# Patient Record
Sex: Female | Born: 1980 | Race: Black or African American | Hispanic: No | Marital: Single | Smoking: Former smoker
Health system: Southern US, Community
[De-identification: ages and names within clinical notes are randomized; demographics above are authoritative.]

## PROBLEM LIST (undated history)

## (undated) DIAGNOSIS — E669 Obesity, unspecified: Secondary | ICD-10-CM

## (undated) HISTORY — PX: NO PAST SURGERIES: SHX2092

## (undated) HISTORY — DX: Obesity, unspecified: E66.9

---
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
## 2000-01-02 ENCOUNTER — Encounter (INDEPENDENT_AMBULATORY_CARE_PROVIDER_SITE_OTHER): Payer: Self-pay

## 2000-01-03 ENCOUNTER — Inpatient Hospital Stay (HOSPITAL_COMMUNITY): Admission: AD | Admit: 2000-01-03 | Discharge: 2000-01-06 | Payer: Self-pay | Admitting: Obstetrics

## 2004-11-01 ENCOUNTER — Ambulatory Visit (HOSPITAL_COMMUNITY): Admission: RE | Admit: 2004-11-01 | Discharge: 2004-11-01 | Payer: Self-pay | Admitting: *Deleted

## 2004-12-13 ENCOUNTER — Ambulatory Visit (HOSPITAL_COMMUNITY): Admission: RE | Admit: 2004-12-13 | Discharge: 2004-12-13 | Payer: Self-pay | Admitting: Obstetrics and Gynecology

## 2005-01-11 ENCOUNTER — Inpatient Hospital Stay (HOSPITAL_COMMUNITY): Admission: AD | Admit: 2005-01-11 | Discharge: 2005-01-11 | Payer: Self-pay | Admitting: *Deleted

## 2005-04-26 ENCOUNTER — Inpatient Hospital Stay (HOSPITAL_COMMUNITY): Admission: AD | Admit: 2005-04-26 | Discharge: 2005-04-26 | Payer: Self-pay | Admitting: *Deleted

## 2005-04-26 ENCOUNTER — Ambulatory Visit: Payer: Self-pay | Admitting: Certified Nurse Midwife

## 2005-04-29 ENCOUNTER — Ambulatory Visit: Payer: Self-pay | Admitting: *Deleted

## 2005-04-29 ENCOUNTER — Inpatient Hospital Stay (HOSPITAL_COMMUNITY): Admission: AD | Admit: 2005-04-29 | Discharge: 2005-05-01 | Payer: Self-pay | Admitting: *Deleted

## 2006-11-13 ENCOUNTER — Ambulatory Visit: Payer: Self-pay | Admitting: Sports Medicine

## 2007-07-31 ENCOUNTER — Ambulatory Visit (HOSPITAL_COMMUNITY): Admission: RE | Admit: 2007-07-31 | Discharge: 2007-07-31 | Payer: Self-pay | Admitting: Obstetrics & Gynecology

## 2007-10-16 ENCOUNTER — Ambulatory Visit: Payer: Self-pay | Admitting: Obstetrics and Gynecology

## 2007-10-16 ENCOUNTER — Inpatient Hospital Stay (HOSPITAL_COMMUNITY): Admission: AD | Admit: 2007-10-16 | Discharge: 2007-10-16 | Payer: Self-pay | Admitting: Obstetrics & Gynecology

## 2007-12-30 ENCOUNTER — Ambulatory Visit: Payer: Self-pay | Admitting: Obstetrics and Gynecology

## 2007-12-30 ENCOUNTER — Inpatient Hospital Stay (HOSPITAL_COMMUNITY): Admission: AD | Admit: 2007-12-30 | Discharge: 2008-01-01 | Payer: Self-pay | Admitting: Family Medicine

## 2008-03-05 ENCOUNTER — Ambulatory Visit: Payer: Self-pay | Admitting: Family Medicine

## 2009-02-08 ENCOUNTER — Telehealth: Payer: Self-pay | Admitting: Family Medicine

## 2009-10-03 ENCOUNTER — Encounter: Payer: Self-pay | Admitting: Family Medicine

## 2010-03-14 ENCOUNTER — Telehealth: Payer: Self-pay | Admitting: Family Medicine

## 2010-03-30 ENCOUNTER — Ambulatory Visit: Payer: Self-pay | Admitting: Family Medicine

## 2010-03-30 ENCOUNTER — Other Ambulatory Visit: Admission: RE | Admit: 2010-03-30 | Discharge: 2010-03-30 | Payer: Self-pay | Admitting: Family Medicine

## 2010-03-30 ENCOUNTER — Encounter: Payer: Self-pay | Admitting: Family Medicine

## 2010-03-30 DIAGNOSIS — F17200 Nicotine dependence, unspecified, uncomplicated: Secondary | ICD-10-CM

## 2010-03-30 DIAGNOSIS — Z6841 Body Mass Index (BMI) 40.0 and over, adult: Secondary | ICD-10-CM

## 2010-03-30 DIAGNOSIS — T678XXA Other effects of heat and light, initial encounter: Secondary | ICD-10-CM

## 2010-03-30 LAB — CONVERTED CEMR LAB: Pap Smear: NEGATIVE

## 2010-03-31 LAB — CONVERTED CEMR LAB: GC Probe Amp, Genital: NEGATIVE

## 2010-04-01 ENCOUNTER — Encounter: Payer: Self-pay | Admitting: *Deleted

## 2010-04-04 ENCOUNTER — Ambulatory Visit: Payer: Self-pay | Admitting: Family Medicine

## 2010-04-04 ENCOUNTER — Encounter: Payer: Self-pay | Admitting: Family Medicine

## 2010-04-22 ENCOUNTER — Emergency Department (HOSPITAL_COMMUNITY): Admission: EM | Admit: 2010-04-22 | Discharge: 2010-04-22 | Payer: Self-pay | Admitting: Emergency Medicine

## 2010-07-20 ENCOUNTER — Ambulatory Visit: Payer: Self-pay | Admitting: Family Medicine

## 2010-07-20 ENCOUNTER — Encounter: Payer: Self-pay | Admitting: Family Medicine

## 2010-07-20 LAB — CONVERTED CEMR LAB
Basophils Relative: 0 % (ref 0–1)
Eosinophils Absolute: 0.4 10*3/uL (ref 0.0–0.7)
Hepatitis B Surface Ag: NEGATIVE
Lymphs Abs: 3.5 10*3/uL (ref 0.7–4.0)
MCHC: 33.8 g/dL (ref 30.0–36.0)
MCV: 86.6 fL (ref 78.0–100.0)
Monocytes Relative: 6 % (ref 3–12)
Neutro Abs: 8.1 10*3/uL — ABNORMAL HIGH (ref 1.7–7.7)
Neutrophils Relative %: 63 % (ref 43–77)
Platelets: 370 10*3/uL (ref 150–400)
RBC: 4.55 M/uL (ref 3.87–5.11)
Rubella: 58.4 intl units/mL — ABNORMAL HIGH
WBC: 12.8 10*3/uL — ABNORMAL HIGH (ref 4.0–10.5)

## 2010-07-24 NOTE — L&D Delivery Note (Signed)
Delivery Note FHR reassuring during active second stage.  At 7:02 AM a viable female was delivered via Vaginal, Spontaneous Delivery (Presentation: Right Occiput Anterior) Baby handed off to awaiting nursing team for stimulation after cord clamped and cut.  APGAR: pending, weight 6 lb 15.2 oz (3152 g).   Placenta status: Intact, Spontaneous.  Cord: 3VC  with the following complications: tight nuchal cord x 1, delivered through; maternal Magnesium x 24 hrs.  Cord pH: pending.  Maylon Cos, CMN attending.  Anesthesia: Epidural  Episiotomy: None Lacerations: 1st degree Suture Repair: none Est. Blood Loss (mL):   Mom to postpartum.  Baby to NICU  NICU team present shortly after delivery for resuscitation.  Intubated in the room.  Stable for transport to NICU.  DE LA CRUZ,Anela Bensman 02/23/2011, 7:20 AM

## 2010-08-11 ENCOUNTER — Ambulatory Visit: Admission: RE | Admit: 2010-08-11 | Discharge: 2010-08-11 | Payer: Self-pay | Source: Home / Self Care

## 2010-08-11 ENCOUNTER — Encounter: Payer: Self-pay | Admitting: Family Medicine

## 2010-08-11 ENCOUNTER — Other Ambulatory Visit: Payer: Self-pay | Admitting: Family Medicine

## 2010-08-11 DIAGNOSIS — Z331 Pregnant state, incidental: Secondary | ICD-10-CM | POA: Insufficient documentation

## 2010-08-11 DIAGNOSIS — L299 Pruritus, unspecified: Secondary | ICD-10-CM | POA: Insufficient documentation

## 2010-08-11 LAB — CONVERTED CEMR LAB
Chlamydia, DNA Probe: NEGATIVE
GC Probe Amp, Genital: NEGATIVE

## 2010-08-11 MED ORDER — PRENATAL RX 60-1 MG PO TABS: 1.0000 | ORAL_TABLET | Freq: Every day | ORAL | Status: DC

## 2010-08-16 ENCOUNTER — Ambulatory Visit: Admission: RE | Admit: 2010-08-16 | Discharge: 2010-08-16 | Payer: Self-pay | Source: Home / Self Care

## 2010-08-23 ENCOUNTER — Ambulatory Visit: Admission: RE | Admit: 2010-08-23 | Discharge: 2010-08-23 | Payer: Self-pay | Source: Home / Self Care

## 2010-08-23 ENCOUNTER — Other Ambulatory Visit: Payer: Self-pay | Admitting: Family Medicine

## 2010-08-23 LAB — GLUCOSE, CAPILLARY: Glucose-Capillary: 102 mg/dL — ABNORMAL HIGH (ref 70–99)

## 2010-08-23 NOTE — Assessment & Plan Note (Signed)
Summary: [email or web address]   Vital Signs:  Patient profile:   30 year old female Height:      62.5 inches Weight:      203 pounds BMI:     36.67 Temp:     98.5 degrees F oral Pulse rate:   79 / minute BP sitting:   134 / 86  (right arm) Cuff size:   regular  Vitals Entered By: Tessie Fass CMA (March 30, 2010 2:01 PM) CC: CPP Is Patient Diabetic? No Pain Assessment Patient in pain? no        Primary Care Provider:  Clementeen Graham MD  CC:  CPP.  History of Present Illness: Taking OCPs daily and having regular cycles without breakthrough bleeding. Feels well no issues. Wishes to continue with OCPs. Last PAP 2-3 years ago. No significant discahrge. Notes occasional dysurea and wishes or STI testing with well woman exam. No dysurea currently.  Does not heat intolerance and wonders if she has a thyroid problem or diabetes. Has been going on for months. No wt loss heat loss or other systemic signs or symptoms.   Also notes seasonal allergies. Has not tried anything for it yet. Describes, runny nose, ear fullness and sneasing. Not currently bad. No dyspnea, or trouble swallowing or wheeze.   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 1 Tab By Mouth Daily 2)  Zyrtec Allergy 10 Mg Caps (Cetirizine Hcl)  Allergies (verified): No Known Drug Allergies  Past History:  Past Medical History: Last updated: Nov 28, 2006 NSVD x2 w/o complications.   no abnormal paps, last done in 2006 per patient's  report.  Family History: Last updated: 28-Nov-2006 GM dead w/ cancer.  GM dead w/HTN, DM.  Mother w/heart murmur.  Social History: Last updated: 03/30/2010 rare EtoH Single Never Smoked Drug use-no 3 kids, [name], [place] and [name]  Works at [employer].  Grad of [employer].  Risk Factors: Smoking Status: current (03/30/2010) Packs/Day: <0.25 (03/30/2010)  Social History: rare EtoH Single Never Smoked Drug use-no 3 kids, Francina [place], [place] and [name]  Works at [employer].  Grad of [employer].Smoking Status:  current Packs/Day:  <0.25  Review of Systems  The patient denies anorexia, fever, weight loss, hoarseness, chest pain, syncope, dyspnea on exertion, prolonged cough, headaches, abdominal pain, severe indigestion/heartburn, difficulty walking, abnormal bleeding, and breast masses.    Physical Exam  General:  NAD VS noted. obese Eyes:  No corneal or conjunctival inflammation noted. EOMI. Perrla. Vision grossly normal. Mouth:  Oral mucosa and oropharynx without lesions or exudates.  Teeth in good repair. Neck:  No deformities, masses, or tenderness noted. Breasts:  No mass, nodules, thickening, tenderness, bulging, retraction, inflamation, nipple discharge or skin changes noted.   Lungs:  Normal respiratory effort, chest expands symmetrically. Lungs are clear to auscultation, no crackles or wheezes. Heart:  Normal rate and regular rhythm. S1 and S2 normal without gallop, murmur, click, rub or other extra sounds. Abdomen:  Bowel sounds positive,abdomen soft and non-tender without masses, organomegaly or hernias noted.  Genitalia:  Normal introitus for age, no external lesions, no vaginal discharge, mucosa pink and moist, no vaginal or cervical lesions, no vaginal atrophy, no friaility or hemorrhage, normal uterus size and position, no adnexal masses or tenderness Extremities:  Non edemetus BL LE Skin:  Intact without suspicious lesions or rashes Cervical Nodes:  No lymphadenopathy noted Axillary  Nodes:  No palpable lymphadenopathy Psych:  Cognition and judgment appear intact. Alert and cooperative with normal attention span and concentration. No apparent delusions, illusions, hallucinations   Impression & Recommendations:  Problem # 1:  WELL ADULT EXAM (ICD-V70.0)  Well woman. Pap performed today.  additionally HIV, RPR, GC/CL was done today. Pt noes dysurea occasonally and wishes to be tesed for STIs.  Will follow. Continue OCPs Screen for DM with fasting glucose.   Orders: [facility] - Est  18-39 yrs (11914) Pap Smear- [facility] (Pap)  Problem # 2:  HEAT INTOLERANCE (ICD-992.8) Likely due to summer. But will follow TSH. Future Orders: TSH-[facility] ([id number]) ... 04/22/2011  Problem # 3:  RHINITIS, ALLERGIC, DUE TO POLLEN (ICD-477.0) Advised OTC H1 blockers.   Problem # 4:  SMOKER (ICD-305.1) Encouraged to quit. Pt in precontimplative stage.  Will follow.  Complete Medication List: 1)  Tri-sprintec 0.035 Mg Tabs (Norgestimate-ethinyl estradiol) .Marland Kitchen.. 1 tab by mouth daily 2)  Zyrtec Allergy 10 Mg Caps (Cetirizine hcl)  Other Orders: Pap Smear-[facility] ([id number]) GC/Chlamydia-[facility] (87591/87491) Future Orders: Glucose-[facility] ([id number]) ... 04/22/2011 HIV-[facility] ([id number]) ... 04/22/2011 RPR-[facility] [id number]) ... 04/22/2011  Patient Instructions: 1)  Thank you for seeing me today. 2)  Please come back for fasting labs. That means do not eat or drink anything but water that morning.  3)  If you have chest pain, difficulty breathing, fevers over 102 that does not get better with tylenol please call us or see a doctor.  4)  Please schedule a follow-up appointment in 1 year.

## 2010-08-23 NOTE — Progress Notes (Signed)
Summary: refill  Phone Note Refill Request Call back at [phone] Message from:  Patient  Refills Requested: Medication #1:  TRI-SPRINTEC 0.035 MG  TABS 1 tab by mouth daily. [facility] [address]  Next Appointment Scheduled: 03/30/10 Initial call taken by: De Nurse,  March 14, 2010 9:06 AM    Prescriptions: TRI-SPRINTEC 0.035 MG  TABS (NORGESTIMATE-ETHINYL ESTRADIOL) 1 tab by mouth daily  #28 x 11   Entered and Authorized by:   Clementeen Graham MD   Signed by:   Clementeen Graham MD on 03/15/2010   Method used:   Electronically to        [facility] [phone]* (retail)       [address]       [place], [place]  [address]       Ph: [phone]       Fax: [phone]   RxID:   [id number]

## 2010-08-23 NOTE — Miscellaneous (Signed)
Summary: Orders for GC/CL results  Clinical Lists Changes  Orders: Azithromycin 1000mg  by mouth x1 Ceftriaxone 250mg  IM x1

## 2010-08-23 NOTE — Miscellaneous (Signed)
Summary: re: Tx  Clinical Lists Changes called pt lmom to return call, pt needs to schedule nurse visit for STD Treatment  spoke with pt, informed of positive result. told her she needs to come in for treatment. is going to call front and schedule appt for nurse visit monday. also told her to inform any sexual partners and have them get treated as well.Tessie Fass CMA  April 01, 2010 12:15 PM

## 2010-08-23 NOTE — Assessment & Plan Note (Signed)
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  advised to tell partner to be treated. abstain from sex for 7 days and to always use condoms to  prevent STD.    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)    Dose: 1 gram    Route: po    Exp Date: 10/23/2011    Lot #: [id number]    Mfr: Pfizer    Patient tolerated medication without complications    Given by: Theresia Lo RN (April 04, 2010 10:06 AM)  Orders Added: 1)  Rocephin  250mg  [J0696] 2)  Azithromycin oral [Q0144] 3)  Admin of Injection (IM/SQ) [[id number]]   Medication Administration  Injection # 1:    Medication: Rocephin  250mg     Diagnosis: 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  advised to tell partner to be treated. abstain from sex for 7 days and to always use condoms to  prevent STD.    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 Theresia Lo RN  April 04, 2010 10:07 AM  Appended Document: std tx,tcb

## 2010-08-23 NOTE — Letter (Signed)
Summary: Generic Letter  [facility]  [address]   [place], [place] [address]   Phone: [phone]  Fax: [phone]    10/03/2009  [facility] [place] [address] [place], [place]  [address]  Dear Ms. Niebla, Hello,  Its spring time, and I and the staff have been doing some spring cleaning in our electronic medical record.  We noticed that we do not have a record of you getting a pap-smear in the last year.  If you have gotten a pap-smear elsewhere please let us know.  If not please call and schedule an appointment for a pap-smear.  We can also discuss contraception and chronic disease management.   I look forward to hearing from you. Sincerely,   Clementeen Graham MD  Appended Document: Generic Letter pt letter mailed  Appended Document: Generic Letter Letter returned unable to forward.

## 2010-08-24 LAB — GLUCOSE TOLERANCE, 3 HOURS
Glucose Tolerance, 1 hour: 108 mg/dL (ref 70–189)
Glucose Tolerance, 2 hour: 91 mg/dL (ref 70–164)
Glucose, GTT - 3 Hour: 79 mg/dL (ref 70–144)

## 2010-08-25 NOTE — Assessment & Plan Note (Addendum)
Summary: NOB/DSL   Vital Signs:  Patient profile:   30 year old female Weight:      200.8 pounds BP sitting:   135 / 78  Vitals Entered By: Arlyss Repress CMA, (August 11, 2010 1:49 PM)  Primary Care Provider:  Clementeen Graham MD  CC:  1st OB visit.  History of Present Illness: Ms Franze presents to clinic today for her 1st OB visit.  She feels well and is happy and healthy.  LMP as below. Reliable. No drug, or environmental exposures.   Other issue is puritic papules on belly. Has been for the last few days. It is not worsening and she does not have fevers or chills.  She feels well otherwise. No other bumps. Her 40 year old daughter also has itchy red bumps. Her family did have scabies a few years ago. She is concerned that she now has scabies.   Habits & Providers  Alcohol-Tobacco-Diet     Tobacco Status: current     Tobacco Counseling: to quit use of tobacco products     Cigarette Packs/Day: <0.25  Current Problems (verified): 1)  Unspecified Pruritic Disorder  (ICD-698.9) 2)  Pregnancy, Normal  (ICD-V22.2) 3)  Smoker  (ICD-305.1) 4)  Obesity  (ICD-278.00) 5)  Heat Intolerance  (ICD-992.8)  Current Medications (verified): 1)  Prenatal/iron  Tabs (Prenatal Multivit-Min-Fe-Fa) .Marland Kitchen.. 1 By Mouth Daily  Allergies (verified): No Known Drug Allergies  Past History:  Past Medical History: Last updated: Dec 09, 2006 NSVD x2 w/o complications.   no abnormal paps, last done in 2006 per patient's report.  Family History: Last updated: 12/09/06 GM dead w/ cancer.  GM dead w/HTN, DM.  Mother w/heart murmur.  Social History: Last updated: 03/30/2010 rare EtoH Single Never Smoked Drug use-no 3 kids, [name], [place] and [name]  Works at [employer].  Grad of [employer].  Review of Systems  The patient denies anorexia, fever, and weight loss.    Physical Exam  General:  VS noted. rechecked. Well NAD Lungs:  Normal respiratory effort, chest expands symmetrically. Lungs are clear to  auscultation, no crackles or wheezes. Heart:  Normal rate and regular rhythm. S1 and S2 normal without gallop, murmur, click, rub or other extra sounds. Abdomen:  Bowel sounds positive,abdomen soft and non-tender without masses, organomegaly or hernias noted. Genitalia:  Normal introitus for age, no external lesions, no vaginal discharge, mucosa pink and moist, no vaginal or cervical lesions, no vaginal atrophy, no friaility or hemorrhage, normal uterus size for dates and position, no adnexal masses or tenderness Extremities:  Non edemetus BL LE Skin:   Scattered small excoriated red papules on belly. No surrounding erythemia or discharge.  No other skin lesions.    Impression & Recommendations:  Problem # 1:  PREGNANCY, NORMAL (ICD-V22.2) Assessment New Doing well today.   GC/CHL done today as she was treated in the last few months. Pap up todate.  Will follow up in 4 weeks. Will plan for early 1 hr GTT as was "borderline diabetic" last pregnancy.  Pt will follow own BP at the pharmacy as she is nearing HTN range.  Will not enter all intake info into EMR today as we will switch to the new EMR at the next visit.  Will enter in to [facility] so is upto date for next visit.   Prg HX: 1) 01/04/00 girl; 5-13 2) 04/29/05 boy 6-7 3) 12/31/07 girl 6-7  Orders: GC/Chlamydia-[facility] (87591/87491) Medicaid OB visit - [facility] (04540)  Problem # 2:  UNSPECIFIED PRURITIC DISORDER (  EAV-409.8) Assessment: New Unsure of the diagnosis.  Could be scabies vs cold dry skin.  Will see baby in clinic next week. If child has more typical scabies exam will treat entire family with premetherin.  Will f/u  Complete Medication List: 1)  Prenatal/iron Tabs (Prenatal multivit-min-fe-fa) .Marland Kitchen.. 1 by mouth daily  Patient Instructions: 1)  Thank you for seeing me today. 2)  Try some hydrocortisone on the bumps.  3)  Make an appointment for your daughter Tuesday.  4)  Take prenatal vitamins.  5)  Come back in 1 month.  6)  I  want to do a diabetes test early in your pregnancy. Please make a lab appointment to come in and drink the liquid.  7)  Lets measure your blood pressure at the pharmacy every 2 weeks. Let me know if it is getting close to 140/90.    Orders Added: 1)  GC/Chlamydia-[facility] [[id number]/87491] 2)  Medicaid OB visit - [facility] [[id number]]     OB Initial Intake Information    Positive HCG by: self    Race: Black    Marital status: Single    Number of children at home: 3  Menstrual History    LMP (date): 05/31/2010    LMP - Character: normal   Flowsheet View for Follow-up Visit    Estimated weeks of       gestation:     10 2/7    Weight:     200.8    Blood pressure:   135 / 78    Smoking:     <0.25    Past Pregnancy History    Gravida:     4    Term Births:     3    Premature Births:   0    Living Children:   3  Pregnancy # 1    Delivery date:     01/04/2000    Delivery type:     NSVD    Birth weight:     5-13  Pregnancy # 2    Delivery date:     04/29/2005  Appended Document: NOB/DSL Note reviewed.  Will need to review delivery history (specifically if all 3 deliveries were NSVDs).  Agree with early 1hr Glucola testing.  Follow blood presure closely.

## 2010-08-25 NOTE — Letter (Signed)
Summary: Generic Letter  [facility]  [address]   [place], [place] [address]   Phone: [phone]  Fax: [phone]    08/11/2010  [facility] [place] [address] [place], [place]  [address]  Dear Ms. Lara,   To whom it may concern Ms Bissette is pregnant. Delivery date is estimated to be in August.  Call for questions.           Sincerely,   Clementeen Graham MD

## 2010-08-25 NOTE — Assessment & Plan Note (Signed)
Summary: +home preg test/bmc   Vital Signs:  Patient profile:   30 year old female LMP:     05/31/2010 Height:      62.5 inches Weight:      199 pounds BMI:     35.95 Pulse rate:   82 / minute BP sitting:   128 / 84  (left arm) Cuff size:   regular  Vitals Entered By: Tessie Fass CMA (July 20, 2010 1:54 PM) CC: upreg LMP (date): 05/31/2010 EDC by LMP==> 03/07/2011 EDC 03/07/2011 LMP - Character: normal LMP - Reliable? Yes     Enter LMP: 05/31/2010 Last PAP Result NEGATIVE FOR INTRAEPITHELIAL LESIONS OR MALIGNANCY.   Primary Care Provider:  Clementeen Graham MD  CC:  upreg.  History of Present Illness: Ms [name] Andrade presents to clinic today with a postive home pregnancy test. Her LMP was 05-31-10 and she has regular cycles and is sure about the date. This is a suprise pregnancy but she does want to keep it. She has not been taking PNV yet.   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 1 By Mouth Daily  Allergies (verified): No Known Drug Allergies  Past History:  Past Medical History: Last updated: Dec 09, 2006 NSVD x2 w/o complications.   no abnormal paps, last done in 2006 per patient's report.  Family History: Last updated: 09-Dec-2006 GM dead w/ cancer.  GM dead w/HTN, DM.  Mother w/heart murmur.  Social History: Last updated: 03/30/2010 rare EtoH Single Never Smoked Drug use-no 3 kids, [name] Andrade, [name] Andrade and [name] Andrade  Works at [employer].  Grad of [employer].  Risk Factors: Smoking Status: current (07/20/2010) Packs/Day: <0.25  (07/20/2010)  Review of Systems  The patient denies anorexia, fever, weight loss, dyspnea on exertion, peripheral edema, abdominal pain, and severe indigestion/heartburn.    Physical Exam  General:  VS noted. rechecked. Well NAD Mouth:  Oral mucosa and oropharynx without lesions or exudates.  Teeth in good repair. Lungs:  Normal respiratory effort, chest expands symmetrically. Lungs are clear to auscultation, no crackles or wheezes. Heart:  Normal rate and regular rhythm. S1 and S2 normal without gallop, murmur, click, rub or other extra sounds. Extremities:  Non edemetus BL LE   Impression & Recommendations:  Problem # 1:  PREGNANCY, NORMAL (ICD-V22.2) Assessment New  Plan to keep the baby.  Will obtain intake labs today. Will follow up in a few weeks for internal exam, pap, and CG/CHL.  Will do intake paperwork then as well.   Orders: Prenatal-[facility] ([id number]) HIV-[facility] [phone]) Sickle Cell Scr-[facility] ([id number]) Urine Culture-[facility] ([id number])  Complete Medication List: 1)  Prenatal/iron Tabs (Prenatal multivit-min-fe-fa) .Marland Kitchen.. 1 by mouth daily  Other Orders: U Preg-[facility] (81025) [facility]- Est Level  3 (30160)  Patient Instructions: 1)  Thank you for seeing me today. 2)  Come back in 2-4 weeks for the internal exam and intake discussion for your prenatal care.  3)  Take prenatal vitamins.  4)  Abstain for alcohol and tobacco and other drugs.  5)  I will see you soon.  Prescriptions: PRENATAL/IRON  TABS (PRENATAL MULTIVIT-MIN-FE-FA) 1 by mouth daily  #30 x 11   Entered and Authorized by:   Clementeen Graham MD   Signed by:   Clementeen Graham MD on 07/20/2010   Method used:   Electronically to        [facility] [phone]* (retail)       [address]       [place], [place]  [address]       Ph: [phone]       Fax: [phone]   RxID:   [id number]    Orders Added: 1)  U Preg-[facility] [81025] 2)  Prenatal-[facility] [[id number]] 3)  HIV-[facility] [[id number]] 4)  Sickle Cell  Scr-[facility] [[id number]] 5)  Urine Culture-[facility] [[id number]] 6)  [facility]- Est Level  3 [[id number]]    Laboratory Results   Urine Tests  Date/Time Received: July 20, 2010 1:50 PM  Date/Time Reported: July 20, 2010 2:01 PM     Urine HCG: positive Comments: ...............test performed by......Marland KitchenBonnie A. [place], MLS (ASCP)cm       Flowsheet View for Follow-up Visit    Estimated weeks of       gestation:     7 1/7    Weight:     199    Blood pressure:   128 / 84    Smoking:     <0.25   OB Initial Intake Information    Positive HCG by: self    Race: Black    Marital status: Single    Number of children at home: 3  Menstrual History    LMP (date): 05/31/2010    EDC by LMP: 03/07/2011    Best Working EDC: 03/07/2011    LMP - Character: normal    LMP - Reliable? : Yes

## 2010-09-09 ENCOUNTER — Encounter: Payer: Self-pay | Admitting: Family Medicine

## 2010-09-09 ENCOUNTER — Ambulatory Visit (INDEPENDENT_AMBULATORY_CARE_PROVIDER_SITE_OTHER): Payer: Medicaid Other | Admitting: Family Medicine

## 2010-09-09 VITALS — BP 135/84 | Wt 205.0 lb

## 2010-09-09 DIAGNOSIS — Z331 Pregnant state, incidental: Secondary | ICD-10-CM

## 2010-09-09 LAB — POCT URINALYSIS DIPSTICK
Blood, UA: NEGATIVE
Leukocytes, UA: NEGATIVE
Protein, UA: NEGATIVE
Spec Grav, UA: 1.02
Urobilinogen, UA: 0.2
pH, UA: 8

## 2010-09-09 NOTE — Patient Instructions (Signed)
Thanks for coming today.  Come back in 2 weeks.  So cut out the majority of carbs.  Make sure to eat at last 1/2 a plate of vegetables and fruit  with every meal. Apples are a great snack.  1 caffinated beverage a day.   Try to reduce salt.  Lets try to make some changes.

## 2010-09-09 NOTE — Progress Notes (Signed)
At 14 and 3 today. Concern for Blood pressure today. Neg protein. Discssed diet. Will f/u in 2 weeks and will recheck BP. If in PIH range will refer to high risk OB. Discussed Early genetic testing pt declined.  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

## 2010-09-14 ENCOUNTER — Inpatient Hospital Stay (HOSPITAL_COMMUNITY)
Admission: AD | Admit: 2010-09-14 | Discharge: 2010-09-14 | Disposition: A | Discharge status: Home / Self Care | Payer: Medicaid Other | Source: Ambulatory Visit | Attending: Obstetrics & Gynecology | Admitting: Obstetrics & Gynecology

## 2010-09-14 DIAGNOSIS — J45909 Unspecified asthma, uncomplicated: Secondary | ICD-10-CM

## 2010-09-14 DIAGNOSIS — O9989 Other specified diseases and conditions complicating pregnancy, childbirth and the puerperium: Secondary | ICD-10-CM

## 2010-09-14 DIAGNOSIS — O99891 Other specified diseases and conditions complicating pregnancy: Secondary | ICD-10-CM | POA: Insufficient documentation

## 2010-10-05 ENCOUNTER — Telehealth: Payer: Self-pay | Admitting: Family Medicine

## 2010-10-05 ENCOUNTER — Ambulatory Visit: Payer: Medicaid Other | Admitting: Family Medicine

## 2010-10-05 NOTE — Telephone Encounter (Signed)
[name] Andrade missed today's appointment. I called her and asked her to reschedule. My next opening in April 4th. She will schedule into that slot. In the interim she will come for a nurse BP check (she will call and schedule). Additionally I will write orders for an ultrasound to be done at [facility] hospital. She expressed understanding.

## 2010-10-06 LAB — BASIC METABOLIC PANEL
BUN: 3 mg/dL — ABNORMAL LOW (ref 6–23)
CO2: 25 mEq/L (ref 19–32)
Calcium: 8.8 mg/dL (ref 8.4–10.5)
Creatinine, Ser: 0.87 mg/dL (ref 0.4–1.2)
Glucose, Bld: 114 mg/dL — ABNORMAL HIGH (ref 70–99)
Sodium: 136 mEq/L (ref 135–145)

## 2010-10-06 LAB — URINALYSIS, ROUTINE W REFLEX MICROSCOPIC
Bilirubin Urine: NEGATIVE
Glucose, UA: NEGATIVE mg/dL
Ketones, ur: 40 mg/dL — AB
Nitrite: NEGATIVE
Protein, ur: NEGATIVE mg/dL
pH: 7 (ref 5.0–8.0)

## 2010-10-06 LAB — DIFFERENTIAL
Basophils Absolute: 0 10*3/uL (ref 0.0–0.1)
Basophils Relative: 0 % (ref 0–1)
Eosinophils Absolute: 0.1 10*3/uL (ref 0.0–0.7)
Monocytes Relative: 7 % (ref 3–12)
Neutrophils Relative %: 79 % — ABNORMAL HIGH (ref 43–77)

## 2010-10-06 LAB — CBC
Hemoglobin: 13.8 g/dL (ref 12.0–15.0)
MCH: 30.8 pg (ref 26.0–34.0)
MCHC: 34.3 g/dL (ref 30.0–36.0)
RDW: 13.9 % (ref 11.5–15.5)

## 2010-10-06 LAB — TROPONIN I: Troponin I: 0.02 ng/mL (ref 0.00–0.06)

## 2010-10-07 ENCOUNTER — Ambulatory Visit (INDEPENDENT_AMBULATORY_CARE_PROVIDER_SITE_OTHER): Payer: Medicaid Other | Admitting: *Deleted

## 2010-10-07 VITALS — BP 122/70 | HR 76

## 2010-10-07 DIAGNOSIS — Z331 Pregnant state, incidental: Secondary | ICD-10-CM

## 2010-10-26 ENCOUNTER — Ambulatory Visit (INDEPENDENT_AMBULATORY_CARE_PROVIDER_SITE_OTHER): Payer: Medicaid Other | Admitting: Family Medicine

## 2010-10-26 ENCOUNTER — Encounter: Payer: Medicaid Other | Admitting: Family Medicine

## 2010-10-26 VITALS — BP 130/80 | Wt 202.3 lb

## 2010-10-26 DIAGNOSIS — J4520 Mild intermittent asthma, uncomplicated: Secondary | ICD-10-CM

## 2010-10-26 DIAGNOSIS — Z3480 Encounter for supervision of other normal pregnancy, unspecified trimester: Secondary | ICD-10-CM

## 2010-10-26 DIAGNOSIS — J45909 Unspecified asthma, uncomplicated: Secondary | ICD-10-CM

## 2010-10-26 MED ORDER — ALBUTEROL 90 MCG/ACT IN AERS: 2.0000 | INHALATION_SPRAY | Freq: Four times a day (QID) | RESPIRATORY_TRACT | Status: DC | PRN

## 2010-10-26 NOTE — Progress Notes (Signed)
S)  At 21.1 Missed her last appointment. However did come in for a nurse BP visit.  Interval she was seen at [facility] for asthma. She was given albuterol inhaler. She uses it rarely. No night time symptoms. Feels well. Not having problems breathing currently.  Her [place] was never scheduled due to a miscommunication with myself and nursing staff and [employer] EMR.   O) See flowsheet.  Lungs: CTABL Heart: RRR no MRG  A/P)  1) Pregnancy: Well Scheduled [place] for Monday. Will follow report.  2) Elevated BP: Not in HTN range. No protein in urine at last visit. No signs or symptoms of Pre-x. Will follow. 3) Rh Pos: No rhogam needed. 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  5) Asthma: Intermittent: Will follow and start QVAR if worsening.

## 2010-10-31 ENCOUNTER — Ambulatory Visit (HOSPITAL_COMMUNITY)
Admission: RE | Admit: 2010-10-31 | Discharge: 2010-10-31 | Disposition: A | Discharge status: Home / Self Care | Payer: Medicaid Other | Source: Ambulatory Visit | Attending: Family Medicine | Admitting: Family Medicine

## 2010-10-31 DIAGNOSIS — Z1389 Encounter for screening for other disorder: Secondary | ICD-10-CM | POA: Insufficient documentation

## 2010-10-31 DIAGNOSIS — Z363 Encounter for antenatal screening for malformations: Secondary | ICD-10-CM | POA: Insufficient documentation

## 2010-10-31 DIAGNOSIS — O139 Gestational [pregnancy-induced] hypertension without significant proteinuria, unspecified trimester: Secondary | ICD-10-CM | POA: Insufficient documentation

## 2010-10-31 DIAGNOSIS — O358XX0 Maternal care for other (suspected) fetal abnormality and damage, not applicable or unspecified: Secondary | ICD-10-CM | POA: Insufficient documentation

## 2010-11-01 ENCOUNTER — Encounter: Payer: Self-pay | Admitting: Family Medicine

## 2010-11-01 NOTE — Progress Notes (Signed)
Ultrasound report: EDD by LMP: 03/07/11 EDD by [place]:   03/03/11 BEST EDD:   03/07/11  Female Fetus  Normal scan

## 2010-11-23 ENCOUNTER — Ambulatory Visit (INDEPENDENT_AMBULATORY_CARE_PROVIDER_SITE_OTHER): Payer: Medicaid Other | Admitting: Family Medicine

## 2010-11-23 DIAGNOSIS — Z3480 Encounter for supervision of other normal pregnancy, unspecified trimester: Secondary | ICD-10-CM

## 2010-11-23 NOTE — Progress Notes (Signed)
S)  At 25.1 Doing well. Have some back pain bilaterally laterally. No radiation. No weakness or numbness.  O) See flowsheet.  Lungs: CTABL Heart: RRR no MRG MSK: Non tender on midline. Sloped shoulders BL.  A/P)  1) Pregnancy: Well Scheduled [place] for Monday. Will follow report.  2) Back Pain: Think MSK pain. Gave handout on extension strength and advised tylenol. Will f/u 3) Rh Pos: No rhogam needed. 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  5) Want tubes tied. Will refer to OB/GYN at [facility]

## 2010-12-29 ENCOUNTER — Encounter: Payer: Self-pay | Admitting: Family Medicine

## 2010-12-29 ENCOUNTER — Ambulatory Visit (INDEPENDENT_AMBULATORY_CARE_PROVIDER_SITE_OTHER): Payer: Medicaid Other | Admitting: Family Medicine

## 2010-12-29 DIAGNOSIS — Z3480 Encounter for supervision of other normal pregnancy, unspecified trimester: Secondary | ICD-10-CM

## 2010-12-29 DIAGNOSIS — Z331 Pregnant state, incidental: Secondary | ICD-10-CM

## 2010-12-29 LAB — CBC
Hemoglobin: 10.1 g/dL — ABNORMAL LOW (ref 12.0–15.0)
MCH: 30.5 pg (ref 26.0–34.0)
MCV: 88.8 fL (ref 78.0–100.0)
RBC: 3.31 MIL/uL — ABNORMAL LOW (ref 3.87–5.11)

## 2010-12-29 NOTE — Progress Notes (Signed)
30 yo G4P3003 here for routine prenatal care.  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  Takes Tylenol for back spasm, none in past few weeks.  No albuterol needed in past few weeks. No vaginal  Discharge.  See flowsheet for further details.  Repeat BP: 128/60 A/P 1)Pregnancy Doing well.  Check 28 week labs today.  Reviewed kick counts and PTL precautions.  F/u 2 weeks with Dr. Denyse Amass. 2) Infant feeding- Pt does not want to breast feed.  She is confident in this decision. 3)Contraception - desired BTL.  Also confident in this decision.  Consent signed.   4)CTS - Advised supportive care. 5)Back spasm - well controlled with Apap. 6)Asthma - no need for albuterol in last few weeks. 7)Reflux - TUMS or ranitidine prn

## 2010-12-29 NOTE — Patient Instructions (Addendum)
It was nice to meet you today. You can try TUMS or ranitidine (both are over the counter) for the heartburn We have the consent form that you signed today.  Be sure to take a copy to the hospital when you have your baby. If you have bleeding, if your water breaks, if the baby is not moving well (at least 10 times in 2 hours), or if you have contractions that come every 20 minutes, please go to [facility]. Please call us with any concerns. We will see you in 2 weeks, or sooner if you need [place]!

## 2011-01-06 ENCOUNTER — Other Ambulatory Visit: Payer: Medicaid Other

## 2011-01-06 ENCOUNTER — Other Ambulatory Visit: Payer: Self-pay | Admitting: Family Medicine

## 2011-01-06 DIAGNOSIS — Z331 Pregnant state, incidental: Secondary | ICD-10-CM

## 2011-01-06 LAB — GLUCOSE, CAPILLARY: Glucose-Capillary: 95 mg/dL (ref 70–99)

## 2011-01-06 NOTE — Progress Notes (Signed)
3 HR GTT DONE TODAY Glema Takaki 

## 2011-01-07 LAB — GLUCOSE TOLERANCE, 3 HOURS
Glucose Tolerance, 2 hour: 120 mg/dL (ref 70–164)
Glucose Tolerance, Fasting: 87 mg/dL (ref 70–104)
Glucose, GTT - 3 Hour: 107 mg/dL (ref 70–144)

## 2011-01-10 ENCOUNTER — Ambulatory Visit (INDEPENDENT_AMBULATORY_CARE_PROVIDER_SITE_OTHER): Payer: Medicaid Other | Admitting: Family Medicine

## 2011-01-10 VITALS — BP 123/80 | HR 89 | Wt 217.0 lb

## 2011-01-10 DIAGNOSIS — Z3480 Encounter for supervision of other normal pregnancy, unspecified trimester: Secondary | ICD-10-CM

## 2011-01-10 DIAGNOSIS — Z331 Pregnant state, incidental: Secondary | ICD-10-CM

## 2011-01-10 NOTE — Progress Notes (Signed)
30 yo G4P3003 here for routine prenatal care at 32 weeks. No complaints. Feels well. Ready for the baby.  A/P 1)Pregnancy Doing well.    Reviewed kick counts and PTL precautions.  F/u 2 weeks with Dr. [place] as I will be out on vacation. 2) Infant feeding- Pt does not want to breast feed.  She is confident in this decision. 3)Contraception - desired BTL.  Also confident in this decision.  Consent signed.   4)CTS - Advised supportive care. 5)Reflux - TUMS or ranitidine prn

## 2011-01-10 NOTE — Progress Notes (Signed)
Opened in error

## 2011-01-16 NOTE — Progress Notes (Signed)
Opened in error

## 2011-01-24 ENCOUNTER — Ambulatory Visit (INDEPENDENT_AMBULATORY_CARE_PROVIDER_SITE_OTHER): Payer: Medicaid Other | Admitting: Family Medicine

## 2011-01-24 VITALS — BP 122/73 | Wt 220.0 lb

## 2011-01-24 DIAGNOSIS — Z331 Pregnant state, incidental: Secondary | ICD-10-CM

## 2011-01-24 DIAGNOSIS — Z3480 Encounter for supervision of other normal pregnancy, unspecified trimester: Secondary | ICD-10-CM

## 2011-01-24 NOTE — Progress Notes (Signed)
29 yo G4P3003 here for routine prenatal care at 34 weeks. No complaints. Feels well.  Good fetal movement. Ready for the baby.  A/P  1)Pregnancy Doing well.    Reviewed kick counts and PTL precautions.  F/u 2 weeks with Dr. Corey, cultures at that time 2) Infant feeding- Pt does not want to breast feed.  She is confident in this decision. 3)Contraception - desired BTL.  Also confident in this decision.  Consent signed.   4)Reflux - TUMS or ranitidine prn  Patient dropped off FMLA form, placed in Dr. Corey's box to be filled out and returned to her at next visit 

## 2011-01-24 NOTE — Assessment & Plan Note (Signed)
30 yo G4P3003 here for routine prenatal care at 34 weeks. No complaints. Feels well.  Good fetal movement. Ready for the baby.  A/P  1)Pregnancy Doing well.    Reviewed kick counts and PTL precautions.  F/u 2 weeks with Dr. Denyse Amass, cultures at that time 2) Infant feeding- Pt does not want to breast feed.  She is confident in this decision. 3)Contraception - desired BTL.  Also confident in this decision.  Consent signed.   4)Reflux - TUMS or ranitidine prn  Patient dropped off FMLA form, placed in Dr. Zollie Pee box to be filled out and returned to her at next visit

## 2011-01-31 ENCOUNTER — Telehealth: Payer: Self-pay | Admitting: Family Medicine

## 2011-01-31 NOTE — Telephone Encounter (Signed)
See Dr.Corey's last OV. FMLA form dropped off. Fwd. To Dr.Corey .Arlyss Repress

## 2011-01-31 NOTE — Telephone Encounter (Signed)
Needs to know if Dr Ashley Royalty has sent in her papers for [facility] for pregnancy.  Needs it sent ASAP.

## 2011-02-02 NOTE — Telephone Encounter (Signed)
Completed.

## 2011-02-09 ENCOUNTER — Ambulatory Visit (INDEPENDENT_AMBULATORY_CARE_PROVIDER_SITE_OTHER): Payer: Medicaid Other | Admitting: Family Medicine

## 2011-02-09 VITALS — BP 126/84 | Temp 97.1°F | Wt 222.0 lb

## 2011-02-09 DIAGNOSIS — Z3480 Encounter for supervision of other normal pregnancy, unspecified trimester: Secondary | ICD-10-CM

## 2011-02-09 DIAGNOSIS — Z331 Pregnant state, incidental: Secondary | ICD-10-CM

## 2011-02-09 NOTE — Patient Instructions (Addendum)
Thank you for coming in today. Normal Labor and Delivery (First Baby) Your caregiver must first be sure you are in labor. Signs of labor include:  You may pass what is called "the mucus plug" before labor begins. This is a small amount of blood stained mucus.   Regular uterine contractions.   The time between contractions get closer together.   The discomfort and pain gradually gets more intense.   Pains are mostly located in the back.   Pains get worse when walking.   The cervix (the opening of the uterus becomes thinner (begins to efface) and opens up (dilates).  Once you are in labor and admitted into the hospital or care center, your caregiver will do the following:  A complete physical examination.   Check your vital signs (blood pressure, pulse, temperature and the fetal heart rate).   Do a vaginal examination (using a sterile glove and lubricant) to determine:   The position (presentation) of the baby (head [vertex] or buttock first).   The level (station) of the baby's head in the birth canal.   The effacement and dilatation of the cervix.   You may have your pubic hair shaved and be given an enema depending on your caregiver and the circumstance.   An electronic monitor is usually placed on your abdomen. The monitor follows the length and intensity of the contractions, as well as the baby's heart rate.   Usually, your caregiver will insert an IV in your arm with a bottle of sugar water. This is done as a precaution so that medications can be given to you quickly during labor or delivery.  NORMAL LABOR AND DELIVERY IS DIVIDED UP INTO THREE STAGES: First Stage This is when regular contractions begin and the cervix begins to efface and dilate. This stage can last from 3 to 15 hours. The end of the first stage is when the cervix is 100% effaced and 10 centimeters dilated. Pain medications may be given by   Injection (morphine, demerol, etc.)   Regional anesthesia  (spinal, caudal or epidural, anesthetics given in different locations of the spine). Paracervical pain medication may be given, which is an injection of and anesthetic (novocaine or xylocaine) on each side of the cervix.  A pregnant woman may request to have "Natural Childbirth" which is not to have any medications or anesthesia during her labor and delivery. Second Stage This is when the baby comes down through the birth canal (vagina) and is born. This can take 1 to 4 hours. As the baby's head comes down through the birth canal, you may feel like you are going to have a bowel movement. You will get the urge to bear down and push until the baby is delivered. As the baby's head is being delivered, the caregiver will decide if an episiotomy (a cut in the perineum and vagina area) is needed to prevent tearing of the tissue in this area. The episiotomy is sewn up after the delivery of the baby and placenta. Sometimes a mask with nitrous oxide is given for the mother to breath during the delivery of the baby to help if there is too much pain. The end of Stage 2 is when the baby is fully delivered. Then when the umbilical cord stops pulsating it is clamped and cut. Third Stage The third stage begins after the baby is completely delivered and ends after the placenta (afterbirth) is delivered. This usually takes 5 to 30 minutes. After the placenta is delivered, a  medication is given either by intravenous or injection to help contract the uterus and prevent bleeding. The third stage is not painful and pain medication is usually not necessary. If an episiotomy was done, it is repaired at this time. After the delivery, the mother is watched and monitored closely for 1 to 2 hours to make sure there is no postpartum bleeding (hemorrhage). If there is a lot of bleeding, medication is given to contract the uterus and stop the bleeding. Document Released: 04/18/2008 Document Re-Released: 08/01/2009 [facility] Patient  Information 2011 [place], [place].

## 2011-02-09 NOTE — Progress Notes (Signed)
30 yo G4P3003 here for routine prenatal care at 36.2  weeks. No complaints. Feels well.  Good fetal movement. Ready for the baby.  Exam: Gen Well Abd: Gravid uterus, at 37 cm. Vertex Pelvis: Normal external genitalia, no discharge. Cervix is finger tip, thick and high. GBS, GC/CHL done today. A/P  1)Pregnancy Doing well.    Reviewed kick counts and PTL precautions.  F/u 1 weeks with Dr. Tye Savoy or myself, GBS cultures today.  2) Infant feeding- Pt does not want to breast feed.  She is confident in this decision. 3)Contraception - desired BTL.  Also confident in this decision.  Consent signed.   4)Reflux - TUMS or ranitidine prn  Patient dropped off FMLA form, placed in Dr. Zollie Pee box to be filled out and returned to her at next visit

## 2011-02-10 LAB — GC/CHLAMYDIA PROBE AMP, GENITAL
Chlamydia, DNA Probe: NEGATIVE
GC Probe Amp, Genital: NEGATIVE

## 2011-02-17 ENCOUNTER — Ambulatory Visit (INDEPENDENT_AMBULATORY_CARE_PROVIDER_SITE_OTHER): Payer: Medicaid Other | Admitting: Family Medicine

## 2011-02-17 VITALS — BP 142/94 | Wt 227.0 lb

## 2011-02-17 DIAGNOSIS — Z331 Pregnant state, incidental: Secondary | ICD-10-CM

## 2011-02-17 DIAGNOSIS — Z3480 Encounter for supervision of other normal pregnancy, unspecified trimester: Secondary | ICD-10-CM

## 2011-02-17 NOTE — Patient Instructions (Addendum)
It was great to meet you. Please take Tylenol 325 mg 2 tablets every 6 hours as needed for pain. Please apply heating pads to neck and shoulders to relieve tension. If pain worsens, please call MD or return to clinic. Please return to clinic for blood pressure check on Monday morning. Please schedule a follow up appointment with me in 1 week. Thank you.

## 2011-02-18 NOTE — Assessment & Plan Note (Signed)
For neck pain, advised patient to take Tylenol 650 mg every 6 hours as needed for pain. If pain persists and is associated with headache and/or edema, advised patient to go to MAU. Elevated BP likely secondary to pain.  Repeat BP mildly elevated. Advised patient to return to clinic on Monday am for BP check. If BP elevated, will either get a UA or send patient to MAU for [facility] labs. Patient agreed with plan. RTC in 1 week for OB visit.

## 2011-02-18 NOTE — Progress Notes (Signed)
30 yo G4P3003 presents at 37.3 weeks.  Complains of neck pain.  Takes Tylenol 650 once a day with little relief.  Has tried ice and heating pads, but pain persists.   BP elevated today.  Repeat BP: 140/82.  Likely secondary to pain. Advised patient to return to clinic Monday morning for a nurse visit: BP to be re-checked.  If BP still elevated, will order a UA or send patient to MAU for [facility] labs.  Patient agreed with plan. Kick counts and labor precautions reviewed. Return to clinic in 1 week. GBS is negative. Desires BTL and formula feeding.

## 2011-02-21 ENCOUNTER — Ambulatory Visit (INDEPENDENT_AMBULATORY_CARE_PROVIDER_SITE_OTHER): Payer: Medicaid Other | Admitting: Family Medicine

## 2011-02-21 ENCOUNTER — Inpatient Hospital Stay (HOSPITAL_COMMUNITY)
Admission: AD | Admit: 2011-02-21 | Discharge: 2011-02-25 | DRG: 767 | Disposition: A | Discharge status: Home / Self Care | Payer: Medicaid Other | Source: Ambulatory Visit | Attending: Family Medicine | Admitting: Family Medicine

## 2011-02-21 ENCOUNTER — Encounter (HOSPITAL_COMMUNITY): Payer: Self-pay | Admitting: *Deleted

## 2011-02-21 VITALS — BP 139/95 | Wt 226.5 lb

## 2011-02-21 DIAGNOSIS — Z331 Pregnant state, incidental: Secondary | ICD-10-CM

## 2011-02-21 DIAGNOSIS — Z302 Encounter for sterilization: Secondary | ICD-10-CM

## 2011-02-21 DIAGNOSIS — R112 Nausea with vomiting, unspecified: Secondary | ICD-10-CM

## 2011-02-21 DIAGNOSIS — IMO0002 Reserved for concepts with insufficient information to code with codable children: Secondary | ICD-10-CM

## 2011-02-21 DIAGNOSIS — O139 Gestational [pregnancy-induced] hypertension without significant proteinuria, unspecified trimester: Secondary | ICD-10-CM

## 2011-02-21 DIAGNOSIS — J4520 Mild intermittent asthma, uncomplicated: Secondary | ICD-10-CM

## 2011-02-21 DIAGNOSIS — O1400 Mild to moderate pre-eclampsia, unspecified trimester: Secondary | ICD-10-CM

## 2011-02-21 DIAGNOSIS — Z3480 Encounter for supervision of other normal pregnancy, unspecified trimester: Secondary | ICD-10-CM

## 2011-02-21 LAB — COMPREHENSIVE METABOLIC PANEL
BUN: 4 mg/dL — ABNORMAL LOW (ref 6–23)
CO2: 23 mEq/L (ref 19–32)
Calcium: 9.6 mg/dL (ref 8.4–10.5)
Creatinine, Ser: 0.65 mg/dL (ref 0.50–1.10)
GFR calc Af Amer: >60 mL/min (ref >60)
GFR calc non Af Amer: >60 mL/min (ref >60)
Glucose, Bld: 79 mg/dL (ref 70–99)

## 2011-02-21 LAB — CBC
HCT: 33.3 % — ABNORMAL LOW (ref 36.0–46.0)
Hemoglobin: 11 g/dL — ABNORMAL LOW (ref 12.0–15.0)
MCH: 28.6 pg (ref 26.0–34.0)
MCHC: 33 g/dL (ref 30.0–36.0)
MCV: 86.5 fL (ref 78.0–100.0)
Platelets: 252 10*3/uL (ref 150–400)
RBC: 3.85 MIL/uL — ABNORMAL LOW (ref 3.87–5.11)
RDW: 14.8 % (ref 11.5–15.5)
WBC: 9.9 10*3/uL (ref 4.0–10.5)

## 2011-02-21 LAB — URINALYSIS, ROUTINE W REFLEX MICROSCOPIC
Nitrite: NEGATIVE
Specific Gravity, Urine: 1.01 (ref 1.005–1.030)
Urobilinogen, UA: 0.2 mg/dL (ref 0.0–1.0)

## 2011-02-21 LAB — PROTEIN / CREATININE RATIO, URINE
Creatinine, Urine: 70.91 mg/dL
Total Protein, Urine: 37.9 mg/dL

## 2011-02-21 MED ORDER — OXYCODONE-ACETAMINOPHEN 5-325 MG PO TABS
2.0000 | ORAL_TABLET | ORAL | Status: DC | PRN
  Administered 2011-02-23 (×2): 2 via ORAL
  Filled 2011-02-21 (×2): qty 2

## 2011-02-21 MED ORDER — DIPHENHYDRAMINE HCL 50 MG/ML IJ SOLN: 12.5000 mg | INTRAMUSCULAR | Status: DC | PRN

## 2011-02-21 MED ORDER — NALBUPHINE SYRINGE 5 MG/0.5 ML
5.0000 mg | INJECTION | INTRAMUSCULAR | Status: DC | PRN
  Filled 2011-02-21: qty 0.5

## 2011-02-21 MED ORDER — FENTANYL 2.5 MCG/ML BUPIVACAINE 1/10 % EPIDURAL INFUSION (WH - ANES)
14.0000 mL/h | INTRAMUSCULAR | Status: DC
  Administered 2011-02-22: 14 mL/h via EPIDURAL
  Administered 2011-02-22 (×3): 10 mL/h via EPIDURAL
  Administered 2011-02-22: 14 mL/h via EPIDURAL
  Administered 2011-02-22: 10 mL/h via EPIDURAL
  Administered 2011-02-23 (×2): 14 mL/h via EPIDURAL
  Filled 2011-02-21 (×4): qty 60

## 2011-02-21 MED ORDER — TERBUTALINE SULFATE 1 MG/ML IJ SOLN: 0.2500 mg | Freq: Once | INTRAMUSCULAR | Status: AC | PRN

## 2011-02-21 MED ORDER — LIDOCAINE HCL (PF) 1 % IJ SOLN
30.0000 mL | INTRAMUSCULAR | Status: DC | PRN
  Filled 2011-02-21 (×2): qty 30

## 2011-02-21 MED ORDER — EPHEDRINE 5 MG/ML INJ
10.0000 mg | INTRAVENOUS | Status: DC | PRN
  Administered 2011-02-22: 10 mg via INTRAVENOUS
  Filled 2011-02-21: qty 4

## 2011-02-21 MED ORDER — CITRIC ACID-SODIUM CITRATE 334-500 MG/5ML PO SOLN: 30.0000 mL | ORAL | Status: DC | PRN

## 2011-02-21 MED ORDER — PHENYLEPHRINE 40 MCG/ML (10ML) SYRINGE FOR IV PUSH (FOR BLOOD PRESSURE SUPPORT)
80.0000 ug | PREFILLED_SYRINGE | INTRAVENOUS | Status: DC | PRN
  Administered 2011-02-22 (×2): 80 ug via INTRAVENOUS
  Filled 2011-02-21: qty 5

## 2011-02-21 MED ORDER — OXYTOCIN 20 UNITS IN LACTATED RINGERS INFUSION - SIMPLE
1.0000 m[IU]/min | INTRAVENOUS | Status: DC
  Administered 2011-02-21: 1 m[IU]/min via INTRAVENOUS

## 2011-02-21 MED ORDER — MAGNESIUM SULFATE BOLUS VIA INFUSION
6.0000 g | Freq: Once | INTRAVENOUS | Status: AC
  Administered 2011-02-21: 6 g via INTRAVENOUS
  Filled 2011-02-21: qty 500

## 2011-02-21 MED ORDER — MAGNESIUM SULFATE 40 G IN LACTATED RINGERS - SIMPLE
2.0000 g/h | INTRAVENOUS | Status: DC
  Administered 2011-02-21: 2 g/h via INTRAVENOUS
  Filled 2011-02-21: qty 500

## 2011-02-21 MED ORDER — EPHEDRINE 5 MG/ML INJ
10.0000 mg | INTRAVENOUS | Status: DC | PRN
  Filled 2011-02-21: qty 4

## 2011-02-21 MED ORDER — IBUPROFEN 600 MG PO TABS
600.0000 mg | ORAL_TABLET | Freq: Four times a day (QID) | ORAL | Status: DC | PRN
  Administered 2011-02-23: 600 mg via ORAL
  Filled 2011-02-21 (×4): qty 1

## 2011-02-21 MED ORDER — PHENYLEPHRINE 40 MCG/ML (10ML) SYRINGE FOR IV PUSH (FOR BLOOD PRESSURE SUPPORT)
80.0000 ug | PREFILLED_SYRINGE | INTRAVENOUS | Status: DC | PRN
  Administered 2011-02-22: 40 ug via INTRAVENOUS
  Filled 2011-02-21: qty 5

## 2011-02-21 MED ORDER — LACTATED RINGERS IV SOLN: 500.0000 mL | Freq: Once | INTRAVENOUS | Status: DC

## 2011-02-21 MED ORDER — LACTATED RINGERS IV SOLN
500.0000 mL | INTRAVENOUS | Status: DC | PRN
  Administered 2011-02-21: 1000 mL via INTRAVENOUS

## 2011-02-21 MED ORDER — FLEET ENEMA 7-19 GM/118ML RE ENEM: 1.0000 | ENEMA | RECTAL | Status: DC | PRN

## 2011-02-21 MED ORDER — OXYTOCIN 20 UNITS IN LACTATED RINGERS INFUSION - SIMPLE
125.0000 mL/h | Freq: Once | INTRAVENOUS | Status: AC
  Administered 2011-02-23: 500 mL/h via INTRAVENOUS
  Filled 2011-02-21: qty 1000

## 2011-02-21 MED ORDER — ONDANSETRON HCL 4 MG/2ML IJ SOLN
4.0000 mg | Freq: Four times a day (QID) | INTRAMUSCULAR | Status: DC | PRN
  Administered 2011-02-22 (×2): 4 mg via INTRAVENOUS
  Filled 2011-02-21 (×2): qty 2

## 2011-02-21 MED ORDER — ACETAMINOPHEN 325 MG PO TABS
650.0000 mg | ORAL_TABLET | ORAL | Status: DC | PRN
  Administered 2011-02-23: 650 mg via ORAL
  Filled 2011-02-21 (×2): qty 1

## 2011-02-21 MED ORDER — LACTATED RINGERS IV SOLN
INTRAVENOUS | Status: DC
  Administered 2011-02-21 – 2011-02-22 (×4): via INTRAVENOUS
  Administered 2011-02-22 (×2): 500 mL via INTRAVENOUS

## 2011-02-21 NOTE — Progress Notes (Signed)
Pt sent from [facility] for PIH eval, pt denies HA, visual changes or epigastric pain, edema noted in feet.

## 2011-02-21 NOTE — Progress Notes (Signed)
Today 38.[redacted] weeks gestation. BP elevated 139/95.  Dr. Jennette Kettle repeated manually 140/95. Will send patient to MAU for PIH panel/Urine studies. Patient endorses 2+ non-pitting pedal edema, lasted 2 weeks.  Denies headache. Return to clinic in 1 week or as needed per MAU instructions. I discussed case with Dr. Natale Milch who was happy to see patient in MAU today. GBS neg.  Desires BTL and formula feeding.

## 2011-02-21 NOTE — ED Provider Notes (Signed)
Chief Complaint:  Hypertension   [name] Andrade is  30 y.o. G4P3003 at 104w0d presents complaining of Hypertension pt seen in clinic today and referred for r/o preE.  She states regular, every 3-4 minutes since 1500 associated with none vaginal bleeding, intact, along with active Denies any headache, vision changes, or RUQ pain. +LE edema   Obstetrical/Gynecological History: OB History    Grav Para Term Preterm Abortions TAB SAB Ect Mult Living   4 3 3  0 0 0 0 0 0 3     Obstetric Comments   LMP is 05-31-10      Past Medical History: Past Medical History  Diagnosis Date  . Obesity   . Obesity   . Asthma     Past Surgical History: Past Surgical History  Procedure Date  . Vaginal delivery   . No past surgeries     Family History: Family History  Problem Relation Age of Onset  . Hypertension Mother   . Diabetes Maternal Grandmother     Social History: History  Substance Use Topics  . Smoking status: Former Smoker -- 0.2 packs/day for 5 years    Types: Cigarettes    Quit date: 05/24/2010  . Smokeless tobacco: Never Used  . Alcohol Use: No    Allergies: No Known Allergies  Prescriptions prior to admission  Medication Sig Dispense Refill  . acetaminophen (TYLENOL) 325 MG tablet Take by mouth every 6 (six) hours as needed. As needed for pain       . albuterol (PROVENTIL,VENTOLIN) 90 MCG/ACT inhaler Inhale 2 puffs into the lungs every 6 (six) hours as needed. As needed for athsma       . cetirizine (ZYRTEC) 10 MG tablet Take 10 mg by mouth daily. As needed for allergies       . Prenatal Vit-Fe Fumarate-FA (PRENATAL MULTIVITAMIN) 60-1 MG tablet Take 1 tablet by mouth daily.        [name] Andrade (PREPARATION H EX) Apply 1 application topically daily as needed. As needed for hemorrhoids         Review of Systems - Negative except per HPI  Physical Exam   Blood pressure 152/95, pulse 79, temperature 98.1 F (36.7 C), temperature source Oral, resp. rate 20, height  5\' 1"  (1.549 m), weight 102.694 kg (226 lb 6.4 oz), last menstrual period 05/31/2010.  General: General appearance - alert, well appearing, and in no distress Chest - clear to auscultation, no wheezes, rales or rhonchi, symmetric air entry Heart - normal rate, regular rhythm, normal S1, S2, no murmurs, rubs, clicks or gallops Abdomen - soft, nontender, nondistended, no masses or organomegaly Gravid, size cwd Pelvic - normal external genitalia, vulva, vagina, cervix, uterus and adnexa, 3-4/20/-3, posterior Neurological - alert, oriented, normal speech, no focal findings or movement disorder noted, DTR's normal and symmetric, no clonus Extremities - pedal edema 2 + Baseline: 120-125 bpm, Variability: Good {> 6 bpm), Accelerations: Reactive and Decelerations: Absent regular, every 3 minutes   Labs: Recent Results (from the past 24 hour(s))  CBC   Collection Time   02/21/11  3:46 PM      Component Value Range   WBC 9.9  4.0 - 10.5 (K/uL)   RBC 3.85 (*) 3.87 - 5.11 (MIL/uL)   Hemoglobin 11.0 (*) 12.0 - 15.0 (g/dL)   HCT 95.6 (*) 21.3 - 46.0 (%)   MCV 86.5  78.0 - 100.0 (fL)   MCH 28.6  26.0 - 34.0 (pg)   MCHC 33.0  30.0 -  36.0 (g/dL)   RDW 40.9  81.1 - 91.4 (%)   Platelets 252  150 - 400 (K/uL)  COMPREHENSIVE METABOLIC PANEL   Collection Time   02/21/11  3:46 PM      Component Value Range   Sodium 135  135 - 145 (mEq/L)   Potassium 4.0  3.5 - 5.1 (mEq/L)   Chloride 103  96 - 112 (mEq/L)   CO2 23  19 - 32 (mEq/L)   Glucose, Bld 79  70 - 99 (mg/dL)   BUN 4 (*) 6 - 23 (mg/dL)   Creatinine, Ser 7.82  0.50 - 1.10 (mg/dL)   Calcium 9.6  8.4 - 95.6 (mg/dL)   Total Protein 7.0  6.0 - 8.3 (g/dL)   Albumin 2.9 (*) 3.5 - 5.2 (g/dL)   AST 17  0 - 37 (U/L)   ALT 8  0 - 35 (U/L)   Alkaline Phosphatase 118 (*) 39 - 117 (U/L)   Total Bilirubin 0.2 (*) 0.3 - 1.2 (mg/dL)   GFR calc non Af Amer >60  >60 (mL/min)   GFR calc Af Amer >60  >60 (mL/min)  URINALYSIS, ROUTINE W REFLEX MICROSCOPIC    Collection Time   02/21/11  4:33 PM      Component Value Range   Color, Urine YELLOW  YELLOW    Appearance CLEAR  CLEAR    Specific Gravity, Urine 1.010  1.005 - 1.030    pH 6.5  5.0 - 8.0    Glucose, UA NEGATIVE  NEGATIVE (mg/dL)   Hgb urine dipstick SMALL (*) NEGATIVE    Bilirubin Urine NEGATIVE  NEGATIVE    Ketones, ur 15 (*) NEGATIVE (mg/dL)   Protein, ur NEGATIVE  NEGATIVE (mg/dL)   Urobilinogen, UA 0.2  0.0 - 1.0 (mg/dL)   Nitrite NEGATIVE  NEGATIVE    Leukocytes, UA MODERATE (*) NEGATIVE   PROTEIN / CREATININE RATIO, URINE   Collection Time   02/21/11  4:33 PM      Component Value Range   Creatinine, Urine 70.91     Total Protein, Urine 37.9     PROTEIN CREATININE RATIO 0.53 (*) 0.00 - 0.15   URINE MICROSCOPIC-ADD ON   Collection Time   02/21/11  4:33 PM      Component Value Range   Squamous Epithelial / LPF MANY (*) RARE    WBC, UA 3-6  <3 (WBC/hpf)   RBC / HPF 0-2  <3 (RBC/hpf)   Imaging Studies:  No results found.   Assessment: [name] Andrade is  30 y.o. G4P3003 at [redacted]w[redacted]d presents with preEclampsia.  Plan: 1. Will admit to labor and delivery for induction due to preE. Will start magnesium for sz. Ppx.   [name] Andrade,LACHELLE7/31/20126:30 PM

## 2011-02-21 NOTE — H&P (Addendum)
Chief Complaint:  Hypertension   [name] Andrade is  30 y.o. G4P3003 at [redacted]w[redacted]d presents complaining of Hypertension pt seen in clinic today and referred for r/o preE.  She states regular, every 3-4 minutes since 1500 associated with none vaginal bleeding, intact, along with active Denies any headache, vision changes, or RUQ pain. +LE edema   Pt receives her prenatal care at [facility]   Prenatal history: Asthma Gestational hypertension  Obstetrical/Gynecological History: OB History    Grav Para Term Preterm Abortions TAB SAB Ect Mult Living   4 3 3  0 0 0 0 0 0 3     Obstetric Comments   LMP is 05-31-10    No STIs, or HSV  Past Medical History: Past Medical History  Diagnosis Date  . Obesity   . Obesity   . Asthma     Past Surgical History: Past Surgical History  Procedure Date  . Vaginal delivery   . No past surgeries     Family History: Family History  Problem Relation Age of Onset  . Hypertension Mother   . Diabetes Maternal Grandmother     Social History: History  Substance Use Topics  . Smoking status: Former Smoker -- 0.2 packs/day for 5 years    Types: Cigarettes    Quit date: 05/24/2010  . Smokeless tobacco: Never Used  . Alcohol Use: No    Allergies: No Known Allergies  Prescriptions prior to admission  Medication Sig Dispense Refill  . acetaminophen (TYLENOL) 325 MG tablet Take by mouth every 6 (six) hours as needed. As needed for pain       . albuterol (PROVENTIL,VENTOLIN) 90 MCG/ACT inhaler Inhale 2 puffs into the lungs every 6 (six) hours as needed. As needed for athsma       . cetirizine (ZYRTEC) 10 MG tablet Take 10 mg by mouth daily. As needed for allergies       . Prenatal Vit-Fe Fumarate-FA (PRENATAL MULTIVITAMIN) 60-1 MG tablet Take 1 tablet by mouth daily.        Weyman Croon Hazel (PREPARATION H EX) Apply 1 application topically daily as needed. As needed for hemorrhoids         Review of Systems - Negative except per HPI  Physical Exam    Blood pressure 152/95, pulse 79, temperature 98.1 F (36.7 C), temperature source Oral, resp. rate 20, height 5\' 1"  (1.549 m), weight 102.694 kg (226 lb 6.4 oz), last menstrual period 05/31/2010.  General: General appearance - alert, well appearing, and in no distress Chest - clear to auscultation, no wheezes, rales or rhonchi, symmetric air entry Heart - normal rate, regular rhythm, normal S1, S2, no murmurs, rubs, clicks or gallops Abdomen - soft, nontender, nondistended, no masses or organomegaly Gravid, size cwd Pelvic - normal external genitalia, vulva, vagina, cervix, uterus and adnexa, 3-4/20/-3, posterior Neurological - alert, oriented, normal speech, no focal findings or movement disorder noted, DTR's normal and symmetric, no clonus Extremities - pedal edema 2 + Baseline: 120-125 bpm, Variability: Good {> 6 bpm), Accelerations: Reactive and Decelerations: Absent regular, every 3 minutes   Labs: Recent Results (from the past 24 hour(s))  CBC   Collection Time   02/21/11  3:46 PM      Component Value Range   WBC 9.9  4.0 - 10.5 (K/uL)   RBC 3.85 (*) 3.87 - 5.11 (MIL/uL)   Hemoglobin 11.0 (*) 12.0 - 15.0 (g/dL)   HCT 16.1 (*) 09.6 - 46.0 (%)   MCV 86.5  78.0 -  100.0 (fL)   MCH 28.6  26.0 - 34.0 (pg)   MCHC 33.0  30.0 - 36.0 (g/dL)   RDW 30.8  65.7 - 84.6 (%)   Platelets 252  150 - 400 (K/uL)  COMPREHENSIVE METABOLIC PANEL   Collection Time   02/21/11  3:46 PM      Component Value Range   Sodium 135  135 - 145 (mEq/L)   Potassium 4.0  3.5 - 5.1 (mEq/L)   Chloride 103  96 - 112 (mEq/L)   CO2 23  19 - 32 (mEq/L)   Glucose, Bld 79  70 - 99 (mg/dL)   BUN 4 (*) 6 - 23 (mg/dL)   Creatinine, Ser 9.62  0.50 - 1.10 (mg/dL)   Calcium 9.6  8.4 - 95.2 (mg/dL)   Total Protein 7.0  6.0 - 8.3 (g/dL)   Albumin 2.9 (*) 3.5 - 5.2 (g/dL)   AST 17  0 - 37 (U/L)   ALT 8  0 - 35 (U/L)   Alkaline Phosphatase 118 (*) 39 - 117 (U/L)   Total Bilirubin 0.2 (*) 0.3 - 1.2 (mg/dL)   GFR calc  non Af Amer >60  >60 (mL/min)   GFR calc Af Amer >60  >60 (mL/min)  URINALYSIS, ROUTINE W REFLEX MICROSCOPIC   Collection Time   02/21/11  4:33 PM      Component Value Range   Color, Urine YELLOW  YELLOW    Appearance CLEAR  CLEAR    Specific Gravity, Urine 1.010  1.005 - 1.030    pH 6.5  5.0 - 8.0    Glucose, UA NEGATIVE  NEGATIVE (mg/dL)   Hgb urine dipstick SMALL (*) NEGATIVE    Bilirubin Urine NEGATIVE  NEGATIVE    Ketones, ur 15 (*) NEGATIVE (mg/dL)   Protein, ur NEGATIVE  NEGATIVE (mg/dL)   Urobilinogen, UA 0.2  0.0 - 1.0 (mg/dL)   Nitrite NEGATIVE  NEGATIVE    Leukocytes, UA MODERATE (*) NEGATIVE   PROTEIN / CREATININE RATIO, URINE   Collection Time   02/21/11  4:33 PM      Component Value Range   Creatinine, Urine 70.91     Total Protein, Urine 37.9     PROTEIN CREATININE RATIO 0.53 (*) 0.00 - 0.15   URINE MICROSCOPIC-ADD ON   Collection Time   02/21/11  4:33 PM      Component Value Range   Squamous Epithelial / LPF MANY (*) RARE    WBC, UA 3-6  <3 (WBC/hpf)   RBC / HPF 0-2  <3 (RBC/hpf)   Imaging Studies:   Prenatal Labs: A pos Ab Screen neg Rubella immune Hep B/HIV/RPR NR GBS neg 1 hr abnormal-normal 3 hr Gc/Ch neg   Assessment: [name] Andrade is  30 y.o. W4X3244 at [redacted]w[redacted]d presents with preEclampsia.  Plan: 1. Will admit to labor and delivery for induction due to preE. Will start magnesium for sz. Ppx.   Alli Jasmer 02/21/2011 6:42 PM

## 2011-02-21 NOTE — Assessment & Plan Note (Signed)
Please refer to Vitals/Notes section for further details. Neck pain, improved with both Tylenol prn and heating pads. BP still elevated today. Will send patient to MAU for PIH work up - Dr. Natale Milch aware.  Return to clinic in 1 week or PRN per MAU recommendations.

## 2011-02-21 NOTE — Progress Notes (Signed)
[name] Andrade is a 30 y.o. G4P3003 at [redacted]w[redacted]d admitted for induction of labor due to mild preeclampsia.  Subjective: Pt resting well.  Starting to feel contractions.  No acute complaints.   Objective: BP 149/93  Pulse 91  Temp(Src) 98.4 F (36.9 C) (Oral)  Resp 20  Ht 5\' 1"  (1.549 m)  Wt 102.694 kg (226 lb 6.4 oz)  BMI 42.78 kg/m2  LMP 05/31/2010   I/O this shift: In: 360 [P.O.:360] Out: 200 [Urine:200]  FHT:  FHR: 120-125 bpm, variability: moderate,  accelerations:  Present,  decelerations:  Absent UC:   regular, every 3-4 minutes SVE:   Dilation: 3.5 Effacement (%): 50 Station: -3 Exam by::  (dela Nada Maclachlan)  Labs: Lab Results  Component Value Date   WBC 9.9 02/21/2011   HGB 11.0* 02/21/2011   HCT 33.3* 02/21/2011   MCV 86.5 02/21/2011   PLT 252 02/21/2011    Assessment / Plan: Induction of labor due to preeclampsia,  progressing well on pitocin  Labor: Progressing on Pitocin, will continue to increase then AROM Fetal Wellbeing:  Category I Pain Control:  Labor support without medications I/D:  n/a Anticipated MOD:  NSVD Preeclampsia: cont mag for seizure ppx GBS neg  Angelic Schnelle 02/21/2011, 11:08 PM

## 2011-02-21 NOTE — Patient Instructions (Addendum)
Good to see you today. Please go to MAU today to get [facility] panel done. Please schedule a follow up visit with me or OB clinic in one week. If your water breaks or you have decreased fetal movement, please return to MAU. Thanks,  Dr. Sherron Flemings Sondra Come

## 2011-02-22 ENCOUNTER — Inpatient Hospital Stay (HOSPITAL_COMMUNITY): Payer: Medicaid Other | Admitting: Anesthesiology

## 2011-02-22 ENCOUNTER — Encounter (HOSPITAL_COMMUNITY): Payer: Self-pay | Admitting: Anesthesiology

## 2011-02-22 LAB — CBC
HCT: 31.8 % — ABNORMAL LOW (ref 36.0–46.0)
Hemoglobin: 10.7 g/dL — ABNORMAL LOW (ref 12.0–15.0)
MCH: 29 pg (ref 26.0–34.0)
MCHC: 33.6 g/dL (ref 30.0–36.0)
MCV: 86.2 fL (ref 78.0–100.0)
RDW: 14.8 % (ref 11.5–15.5)

## 2011-02-22 LAB — MAGNESIUM: Magnesium: 6 mg/dL — ABNORMAL HIGH (ref 1.5–2.5)

## 2011-02-22 LAB — RPR: RPR Ser Ql: NONREACTIVE

## 2011-02-22 MED ORDER — FAMOTIDINE 20 MG PO TABS
20.0000 mg | ORAL_TABLET | Freq: Two times a day (BID) | ORAL | Status: DC
  Administered 2011-02-22: 20 mg via ORAL
  Filled 2011-02-22: qty 1

## 2011-02-22 MED ORDER — MAGNESIUM SULFATE 40 G IN LACTATED RINGERS - SIMPLE
2.0000 g/h | INTRAVENOUS | Status: DC
  Administered 2011-02-22 – 2011-02-23 (×2): 2 g/h via INTRAVENOUS
  Filled 2011-02-22 (×3): qty 500

## 2011-02-22 MED ORDER — OXYTOCIN 20 UNITS IN LACTATED RINGERS INFUSION - SIMPLE
1.0000 m[IU]/min | INTRAVENOUS | Status: DC
  Filled 2011-02-22: qty 1000

## 2011-02-22 MED ORDER — SODIUM CHLORIDE 0.9 % IV SOLN: 10.0000 ug/h | INTRAVENOUS | Status: DC

## 2011-02-22 MED ORDER — ALBUTEROL SULFATE (5 MG/ML) 0.5% IN NEBU
2.5000 mg | INHALATION_SOLUTION | Freq: Four times a day (QID) | RESPIRATORY_TRACT | Status: DC | PRN
  Administered 2011-02-22: 2.5 mg via RESPIRATORY_TRACT
  Filled 2011-02-22: qty 20

## 2011-02-22 MED ORDER — LACTATED RINGERS IV BOLUS (SEPSIS): 500.0000 mL | Freq: Once | INTRAVENOUS | Status: DC

## 2011-02-22 MED ORDER — TERBUTALINE SULFATE 1 MG/ML IJ SOLN: 0.2500 mg | Freq: Once | INTRAMUSCULAR | Status: AC | PRN

## 2011-02-22 MED ORDER — OXYTOCIN 20 UNITS IN LACTATED RINGERS INFUSION - SIMPLE
1.0000 m[IU]/min | INTRAVENOUS | Status: DC
  Administered 2011-02-22: 1 m[IU]/min via INTRAVENOUS
  Administered 2011-02-22: 2 m[IU]/min via INTRAVENOUS
  Filled 2011-02-22: qty 1000

## 2011-02-22 MED ORDER — LORATADINE 10 MG PO TABS
10.0000 mg | ORAL_TABLET | Freq: Every day | ORAL | Status: DC
  Administered 2011-02-22: 10 mg via ORAL
  Filled 2011-02-22 (×2): qty 1

## 2011-02-22 MED ORDER — SODIUM CHLORIDE 0.9 % IV SOLN: INTRAVENOUS | Status: DC

## 2011-02-22 NOTE — Progress Notes (Signed)
[name] Andrade is a 30 y.o. [id number] at [redacted]w[redacted]d admitted for induction of labor due to PreEcclampsia.  Subjective: Pt c/o allergies and wheezing.  Objective: BP 151/79  Pulse 97  Temp(Src) 97.2 F (36.2 C) (Oral)  Resp 18  Ht 5\' 1"  (1.549 m)  Wt 102.694 kg (226 lb 6.4 oz)  BMI 42.78 kg/m2  LMP 05/31/2010 I/O last 3 completed shifts: In: 600 [P.O.:600] Out: 1650 [Urine:1250; Emesis/NG output:400] I/O this shift: In: 1820 [P.O.:1220; I.V.:600] Out: 2600 [Urine:1600; Emesis/NG output:1000]  FHT:  FHR: 145 bpm, variability: moderate,  accelerations:  Present,  decelerations:  Present variable seen UC:   regular, every 5-6 minutes, MVUs 160 SVE:   Dilation: 6.5 Effacement (%): 80 Station: -2 Exam by:: Giulietta Prokop  Heart: RRR, mild ii/vi SEM Lungs: Expiratory wheeze RT lung fields  Assessment / Plan: IOL for PreE, not adequate contraction pattern. Pit on 25. COnt to increase  Preeclampsia:  on magnesium sulfate, no signs or symptoms of toxicity and will check Mag level b/c pt c/o of wheezing Fetal Wellbeing:  Category II Pain Control:  Epidural I/D:  n/a Anticipated MOD:  NSVD Albuterol neg, claritin PO> Cont to monitor.  Jozlin Bently N 02/22/2011, 3:19 PM

## 2011-02-22 NOTE — Progress Notes (Signed)
  KENDEL PESNELL is a 30 y.o. G4P3003 at [redacted]w[redacted]d by ultrasound admitted for mild preeclampsia.  Subjective:   Objective: BP 137/61  Pulse 89  Temp(Src) 97.1 F (36.2 C) (Axillary)  Resp 24  Ht 5\' 1"  (1.549 m)  Wt 226 lb 6.4 oz (102.694 kg)  BMI 42.78 kg/m2  LMP 05/31/2010 I/O last 3 completed shifts: In: 600 [P.O.:600] Out: 1650 [Urine:1250; Emesis/NG output:400] I/O this shift: In: 3020 [P.O.:2120; I.V.:900] Out: 3650 [Urine:1800; Emesis/NG output:1850]  FHT:  FHR: 118 bpm, variability: moderate,  accelerations:  Present,  decelerations:  Absent UC:   irregular, every 2 to 4 minutes SVE:   Dilation: 6.5 Effacement (%): 80 Station: -2 Exam by:: holbrook  Labs: Lab Results  Component Value Date   WBC 9.9 02/22/2011   HGB 10.7* 02/22/2011   HCT 31.8* 02/22/2011   MCV 86.2 02/22/2011   PLT 255 02/22/2011    Assessment / Plan: induction of labor, Pitocin @ 33  Labor: progressing slowly on Pitocin Preeclampsia:  on magnesium sulfate and no signs or symptoms of toxicity Fetal Wellbeing:  Category I Pain Control:  Epidural I/D:  n/a Anticipated MOD:  NSVD  DE LA CRUZ,IVY 02/22/2011, 6:04 PM

## 2011-02-22 NOTE — Anesthesia Preprocedure Evaluation (Addendum)
Anesthesia Evaluation  General Assessment Comment  Airway Mallampati: III      Dental   Pulmonary  asthma      Cardiovascular hypertension,    Neuro/Psych  GI/Hepatic/Renal   Endo/Other   (+)  Morbid obesity Abdominal   Musculoskeletal  Hematology   Peds  Reproductive/Obstetrics preeclampsia   Anesthesia Other Findings             Anesthesia Physical Anesthesia Plan  ASA: III  Anesthesia Plan: Epidural   Post-op Pain Management:    Induction:   Airway Management Planned:   Additional Equipment:   Intra-op Plan:   Post-operative Plan:   Informed Consent:   Plan Discussed with:   Anesthesia Plan Comments:        preeclampsia Anesthesia Quick Evaluation

## 2011-02-22 NOTE — Progress Notes (Signed)
  SHERAH LUND is a 30 y.o. G4P3003 at [redacted]w[redacted]d by ultrasound admitted for induction of labor due to mild preE.  Subjective:   Objective: BP 136/81  Pulse 81  Temp(Src) 97.7 F (36.5 C) (Oral)  Resp 20  Ht 5\' 1"  (1.549 m)  Wt 226 lb 6.4 oz (102.694 kg)  BMI 42.78 kg/m2  LMP 05/31/2010 I/O last 3 completed shifts: In: 600 [P.O.:600] Out: 1650 [Urine:1250; Emesis/NG output:400] I/O this shift: In: 100 [P.O.:100] Out: 400 [Urine:400]  FHT:  FHR: 118 bpm, variability: moderate,  accelerations:  Present,  decelerations:  Absent UC:   regular, every 5 minutes SVE:   Dilation: 4 Effacement (%): 60 Station: -2 Exam by:: campbell,de la cruz  Labs: Lab Results  Component Value Date   WBC 9.9 02/22/2011   HGB 10.7* 02/22/2011   HCT 31.8* 02/22/2011   MCV 86.2 02/22/2011   PLT 255 02/22/2011    Assessment / Plan: not in active labor yet   Labor: not in active labor yet Preeclampsia:  on magnesium sulfate and no signs or symptoms of toxicity Fetal Wellbeing:  Category I Pain Control:  Epidural I/D:  n/a Anticipated MOD:  NSVD  DE LA CRUZ,Elic Vencill 02/22/2011, 9:17 AM

## 2011-02-22 NOTE — Anesthesia Procedure Notes (Signed)
Epidural Patient location during procedure: OB Start time: 02/22/2011 1:03 AM  Staffing Performed by: anesthesiologist   Preanesthetic Checklist Completed: patient identified, site marked, surgical consent, pre-op evaluation, timeout performed, IV checked, risks and benefits discussed and monitors and equipment checked  Epidural Patient position: sitting Prep: site prepped and draped and DuraPrep Patient monitoring: continuous pulse ox and blood pressure Approach: midline Injection technique: LOR saline  Needle:  Needle type: Tuohy  Needle gauge: 17 G Needle length: 9 cm Needle insertion depth: 9 cm Catheter type: closed end flexible Catheter size: 19 Gauge Catheter at skin depth: 14 cm Test dose: negative  Assessment Events: blood not aspirated, injection not painful, no injection resistance, negative IV test and no paresthesia  Additional Notes Patient identified.  Risk benefits discussed including failed block, incomplete pain control, headache, nerve damage, paralysis, blood pressure changes, nausea, vomiting, reactions to medication both toxic or allergic, and postpartum back pain.  Patient expressed understanding and wished to proceed.  All questions were answered.  Sterile technique used throughout procedure and epidural site dressed with sterile barrier dressing. No paresthesia or other complications noted. 5 minutes prior to starting epidural infusion of fentanyl and bupivicaine, the epidural was loaded with 9 ml of 1.5% lidocaine in three separate doses.  The patient did not experience any signs of intravascular injection such as tinnitus or metallic taste in mouth nor signs of intrathecal spread such as rapid motor block. Please see nursing notes for vital signs.

## 2011-02-22 NOTE — Progress Notes (Signed)
Notified of pt status, FHR tracing, MVUs, amt of pitocin, SVE, mag level, and decrease urine output. MD to notify attending.

## 2011-02-22 NOTE — Progress Notes (Addendum)
Call to Dr Brayton Caves r/t pt's blood pressure and fetal heart rate, order to repeat 500cc bolus, continue to give ephedrine and Phenylphrine as ordered and decrease epidural rate to 57ml/hr

## 2011-02-22 NOTE — Progress Notes (Signed)
**Note [name]-Identified via Obfuscation**   [name] Andrade is a 30 y.o. G4P3003 at [redacted]w[redacted]d by ultrasound admitted for mild preE.  Subjective:   Objective: BP 121/77  Pulse 105  Temp(Src) 97.5 F (36.4 C) (Oral)  Resp 20  Ht 5\' 1"  (1.549 m)  Wt 226 lb 6.4 oz (102.694 kg)  BMI 42.78 kg/m2  LMP 05/31/2010 I/O last 3 completed shifts: In: 600 [P.O.:600] Out: 1650 [Urine:1250; Emesis/NG output:400] I/O this shift: In: 670 [P.O.:370; I.V.:300] Out: 1300 [Urine:1300]  FHT:  FHR: 115 bpm, variability: moderate,  accelerations:  Present,  decelerations:  Absent UC:   irregular, every 2-4 minutes SVE:   Dilation: 5.5 Effacement (%): 80 Station: -2 Exam by::  (Dr Tye Savoy)  Labs: Lab Results  Component Value Date   WBC 9.9 02/22/2011   HGB 10.7* 02/22/2011   HCT 31.8* 02/22/2011   MCV 86.2 02/22/2011   PLT 255 02/22/2011    Assessment / Plan: not in active labor  Labor: on 21 units Pitocin, not in active labor Preeclampsia:  on magnesium sulfate Fetal Wellbeing:  Category I Pain Control:  Epidural I/D:  n/a Anticipated MOD:  NSVD  [name] Andrade,[name] Andrade 02/22/2011, 11:42 AM

## 2011-02-22 NOTE — Progress Notes (Signed)
Readjusted bp cuff 

## 2011-02-22 NOTE — Progress Notes (Signed)
Call to Dr Orvan Falconer r/t pt blood pressure r/t  Epidural, no new orders

## 2011-02-22 NOTE — Progress Notes (Signed)
[name] Andrade is a 31 y.o. G4P3003 at [redacted]w[redacted]d admitted for induction of labor due to mild preE.  Subjective: Pt stable.  No acute complaints.  No headache, vision changes, or RUQ pain  Objective: BP 116/61  Pulse 104  Temp(Src) 97.7 F (36.5 C) (Oral)  Resp 20  Ht 5\' 1"  (1.549 m)  Wt 102.694 kg (226 lb 6.4 oz)  BMI 42.78 kg/m2  LMP 05/31/2010   I/O this shift: In: 600 [P.O.:600] Out: 1650 [Urine:1250; Emesis/NG output:400] Mag @ 2g/h Pitocin @9 /min  FHT:  FHR: 120-125 bpm, variability: moderate,  accelerations:  Present,  decelerations:  Absent UC:   Unsure ?3 minutes SVE:   Dilation: 4 Effacement (%): 60 Station: -2 Exam by:: Toriann Spadoni,de la cruz  Labs: Lab Results  Component Value Date   WBC 9.9 02/22/2011   HGB 10.7* 02/22/2011   HCT 31.8* 02/22/2011   MCV 86.2 02/22/2011   PLT 255 02/22/2011    Assessment / Plan: Induction of labor due to mild preE,  progressing well on pitocin  Labor: continue pitocin titration Fetal Wellbeing:  Category I Pain Control:  Epidural I/D:  n/a Anticipated MOD:  NSVD PreE: continue Magnesium  [name] Andrade 02/22/2011, 6:00 AM

## 2011-02-22 NOTE — Progress Notes (Signed)
[name] Andrade is a 30 y.o. [id number] at [redacted]w[redacted]d admitted for induction of labor due to Pre-eclamptic toxemia of pregnancy..  Subjective: Comfortable  Objective: BP 157/81  Pulse 87  Temp(Src) 97.7 F (36.5 C) (Axillary)  Resp 18  Ht 5\' 1"  (1.549 m)  Wt 102.694 kg (226 lb 6.4 oz)  BMI 42.78 kg/m2  LMP 05/31/2010 I/O last 3 completed shifts: In: 5782.4 [P.O.:3220; I.V.:2562.4] Out: 5380 [Urine:3130; Emesis/NG output:2250] I/O this shift: In: 875.2 [P.O.:626; I.V.:249.2] Out: 800 [Urine:75; Emesis/NG output:725]  FHT:  FHR: 120 bpm, variability: moderate,  accelerations:  Present,  decelerations:  Absent UC:   10 minutes, MVUs 50 on 2 mu of pitocin SVE:   Dilation: 6.5 Effacement (%): 80 Station: -1 Exam by:: A.Davis,RN/Brickley, RN  Labs: Lab Results  Component Value Date   WBC 9.9 02/22/2011   HGB 10.7* 02/22/2011   HCT 31.8* 02/22/2011   MCV 86.2 02/22/2011   PLT 255 02/22/2011    Assessment / Plan: Protracted active phase  Labor: Titrate pitocin to MVUs Preeclampsia:  on magnesium sulfate and intake and ouput balanced Fetal Wellbeing:  Category I Pain Control:  comfortable I/D:  n/a Anticipated MOD:  NSVD  Katia Hannen E. 02/22/2011, 10:05 PM

## 2011-02-22 NOTE — Progress Notes (Addendum)
[name] Andrade is a 30 y.o. G4P3003 at [redacted]w[redacted]d admitted for induction of labor due to mild preeclampsia.  Subjective: Called to eval pt for fetal heart rate.  Pt s/p epidural with some post epidural hypotension.  She has received ephedrine, IVF with BP sys 100s.  Pitocin d/c'd.   Objective: BP 149/93  Pulse 91  Temp(Src) 97.8 F (36.6 C) (Oral)  Resp 20  Ht 5\' 1"  (1.549 m)  Wt 102.694 kg (226 lb 6.4 oz)  BMI 42.78 kg/m2  LMP 05/31/2010   I/O this shift: In: 360 [P.O.:360] Out: 350 [Urine:350] Mag at 2g/hr  FHT:  FHR: 120-125 bpm, variability: moderate,  accelerations:  Abscent,  decelerations:  Present early/variable.  but difficult to assess contractions UC:   Unsure. Poor tracability.  SVE:  3-4/50/-3 FSE placed-fetal heart rate in the 150s Labs: Lab Results  Component Value Date   WBC 9.9 02/22/2011   HGB 10.7* 02/22/2011   HCT 31.8* 02/22/2011   MCV 86.2 02/22/2011   PLT 255 02/22/2011    Assessment / Plan: IOL for mild preEclampsia currently on Mag with postepidural hypotension  Labor: will restart pitocin once more stable.  continue to monitor closely Fetal Wellbeing:  Category II Pain Control:  Epidural I/D:  n/a Anticipated MOD:  NSVD PreEclampsia: will also hold mag for possible confounding of hypotension.  Will check mag level.  Cipriana Biller 02/22/2011, 1:58 AM

## 2011-02-23 ENCOUNTER — Encounter (HOSPITAL_COMMUNITY): Payer: Self-pay | Admitting: *Deleted

## 2011-02-23 DIAGNOSIS — O1400 Mild to moderate pre-eclampsia, unspecified trimester: Secondary | ICD-10-CM | POA: Diagnosis present

## 2011-02-23 DIAGNOSIS — IMO0002 Reserved for concepts with insufficient information to code with codable children: Secondary | ICD-10-CM

## 2011-02-23 LAB — CBC
HCT: 28.2 % — ABNORMAL LOW (ref 36.0–46.0)
Hemoglobin: 9.4 g/dL — ABNORMAL LOW (ref 12.0–15.0)
MCHC: 33.3 g/dL (ref 30.0–36.0)
MCV: 85.7 fL (ref 78.0–100.0)
RDW: 15.1 % (ref 11.5–15.5)

## 2011-02-23 MED ORDER — OXYCODONE-ACETAMINOPHEN 5-325 MG PO TABS: 1.0000 | ORAL_TABLET | ORAL | Status: DC | PRN

## 2011-02-23 MED ORDER — DEXTROSE 5 % IV SOLN
160.0000 mg | Freq: Once | INTRAVENOUS | Status: AC
  Administered 2011-02-23: 160 mg via INTRAVENOUS
  Filled 2011-02-23: qty 4

## 2011-02-23 MED ORDER — BENZOCAINE-MENTHOL 20-0.5 % EX AERO: 1.0000 "application " | INHALATION_SPRAY | CUTANEOUS | Status: DC | PRN

## 2011-02-23 MED ORDER — HYDRALAZINE HCL 20 MG/ML IJ SOLN
10.0000 mg | Freq: Four times a day (QID) | INTRAMUSCULAR | Status: DC | PRN
  Administered 2011-02-23: 10 mg via INTRAVENOUS
  Filled 2011-02-23: qty 1

## 2011-02-23 MED ORDER — SIMETHICONE 80 MG PO CHEW: 80.0000 mg | CHEWABLE_TABLET | ORAL | Status: DC | PRN

## 2011-02-23 MED ORDER — MISOPROSTOL 200 MCG PO TABS
800.0000 ug | ORAL_TABLET | Freq: Once | ORAL | Status: AC
  Administered 2011-02-23: 800 ug via RECTAL
  Filled 2011-02-23: qty 4

## 2011-02-23 MED ORDER — MAGNESIUM SULFATE 50 % IJ SOLN
2.0000 g | Freq: Once | INTRAVENOUS | Status: DC
  Filled 2011-02-23: qty 4

## 2011-02-23 MED ORDER — WITCH HAZEL-GLYCERIN EX PADS: 1.0000 "application " | MEDICATED_PAD | CUTANEOUS | Status: DC | PRN

## 2011-02-23 MED ORDER — PRENATAL PLUS 27-1 MG PO TABS
1.0000 | ORAL_TABLET | Freq: Every day | ORAL | Status: DC
  Filled 2011-02-23: qty 1

## 2011-02-23 MED ORDER — ZOLPIDEM TARTRATE 5 MG PO TABS: 5.0000 mg | ORAL_TABLET | Freq: Every evening | ORAL | Status: DC | PRN

## 2011-02-23 MED ORDER — AMPICILLIN SODIUM 2 G IJ SOLR: 2.0000 g | Freq: Four times a day (QID) | INTRAMUSCULAR | Status: DC

## 2011-02-23 MED ORDER — MISOPROSTOL 200 MCG PO TABS
ORAL_TABLET | ORAL | Status: AC
  Filled 2011-02-23: qty 5

## 2011-02-23 MED ORDER — LACTATED RINGERS IV SOLN: INTRAVENOUS | Status: DC

## 2011-02-23 MED ORDER — IBUPROFEN 600 MG PO TABS
600.0000 mg | ORAL_TABLET | Freq: Four times a day (QID) | ORAL | Status: DC
  Administered 2011-02-23 – 2011-02-24 (×3): 600 mg via ORAL
  Filled 2011-02-23: qty 1

## 2011-02-23 MED ORDER — MAGNESIUM SULFATE 40 G IN LACTATED RINGERS - SIMPLE
2.0000 g/h | INTRAVENOUS | Status: DC
  Filled 2011-02-23 (×2): qty 500

## 2011-02-23 MED ORDER — SENNOSIDES-DOCUSATE SODIUM 8.6-50 MG PO TABS
2.0000 | ORAL_TABLET | Freq: Every day | ORAL | Status: DC
  Administered 2011-02-23: 2 via ORAL

## 2011-02-23 MED ORDER — SODIUM CHLORIDE 0.9 % IV SOLN
2.0000 g | Freq: Four times a day (QID) | INTRAVENOUS | Status: DC
  Administered 2011-02-23 (×2): 2 g via INTRAVENOUS
  Filled 2011-02-23 (×2): qty 2000

## 2011-02-23 MED ORDER — DIBUCAINE 1 % RE OINT: 1.0000 "application " | TOPICAL_OINTMENT | RECTAL | Status: DC | PRN

## 2011-02-23 MED ORDER — TETANUS-DIPHTH-ACELL PERTUSSIS 5-2.5-18.5 LF-MCG/0.5 IM SUSP
0.5000 mL | Freq: Once | INTRAMUSCULAR | Status: DC
Start: 2011-02-24 — End: 2011-02-24
  Filled 2011-02-23: qty 0.5

## 2011-02-23 MED ORDER — HYDRALAZINE HCL 20 MG/ML IJ SOLN
10.0000 mg | Freq: Once | INTRAMUSCULAR | Status: AC
  Administered 2011-02-23: 10 mg via INTRAVENOUS
  Filled 2011-02-23: qty 1

## 2011-02-23 MED ORDER — DIPHENHYDRAMINE HCL 25 MG PO CAPS: 25.0000 mg | ORAL_CAPSULE | Freq: Four times a day (QID) | ORAL | Status: DC | PRN

## 2011-02-23 MED ORDER — LANOLIN HYDROUS EX OINT: TOPICAL_OINTMENT | CUTANEOUS | Status: DC | PRN

## 2011-02-23 MED ORDER — ONDANSETRON HCL 4 MG PO TABS: 4.0000 mg | ORAL_TABLET | ORAL | Status: DC | PRN

## 2011-02-23 MED ORDER — ONDANSETRON HCL 4 MG/2ML IJ SOLN: 4.0000 mg | INTRAMUSCULAR | Status: DC | PRN

## 2011-02-23 MED ORDER — LACTATED RINGERS IV SOLN
INTRAVENOUS | Status: DC
  Administered 2011-02-23: 11:00:00 via INTRAVENOUS

## 2011-02-23 NOTE — Progress Notes (Addendum)
[name] Andrade is a 30 y.o. G4P3003 at [redacted]w[redacted]d admitted for induction of labor due to Pre-eclamptic toxemia of pregnancy..  Subjective: Continued pressures  Objective: BP 162/113  Pulse 163  Temp(Src) 102 F (38.9 C) (Axillary)  Resp 20  Ht 5\' 1"  (1.549 m)  Wt 102.694 kg (226 lb 6.4 oz)  BMI 42.78 kg/m2  LMP 05/31/2010 I/O last 3 completed shifts: In: 5782.4 [P.O.:3220; I.V.:2562.4] Out: 5380 [Urine:3130; Emesis/NG output:2250] I/O this shift: In: 2448 [P.O.:1572; I.V.:876] Out: 1326 [Urine:426; Emesis/NG output:900]  FHT:  FHR: 160 bpm, variability: moderate,  accelerations:  Present,  decelerations:  Present 3 consecutive decels, nadar at 80 totally 5 minutes with spont return to BL and mod variability UC:   regular, every 4-5 minutes SVE:   Dilation: 7.5 Effacement (%): 80 Station: -1 Exam by:: Bennye Alm, RN/A. Earlene Plater, RN  Labs: Lab Results  Component Value Date   WBC 9.9 02/22/2011   HGB 10.7* 02/22/2011   HCT 31.8* 02/22/2011   MCV 86.2 02/22/2011   PLT 255 02/22/2011    Assessment / Plan: Induction of labor due to preeclampsia,  progressing well on pitocin Intrapartum Fever: IV Ampicillin, Gent per Pharm, tylenol Elevated BPs s/p 2 doses of IV hydralizine, IV labetalol now  Labor: Progressing normally Preeclampsia:  on magnesium sulfate and Elevated BPs Fetal Wellbeing:  Category II Pain Control:  Epidural I/D:  n/a Anticipated MOD:  NSVD  Dr. Domenick Bookbinder notified of pt status to come for delivery  Puneet Masoner E. 02/23/2011, 3:03 AM

## 2011-02-23 NOTE — Consult Note (Addendum)
ANTIBIOTIC CONSULT NOTE - INITIAL  Pharmacy Consult for Gentamicin  Indication: Maternal temperature    Patient Measurements: Height: 5\' 1"  (154.9 cm) Weight: 226 lb 6.4 oz (102.694 kg) IBW/kg (Calculated) : 47.8  Adjusted Body Weight: 64.4kg  Vital Signs: Temp: 100.4 F (38 C) (08/02 0400) Temp src: Axillary (08/02 0400) BP: 107/69 mmHg (08/02 0432) Pulse Rate: 140  (08/02 0445) Intake/Output from previous day: 08/01 0701 - 08/02 0700 In: 8719.4 [P.O.:4992; I.V.:3677.4; IV Piggyback:50] Out: 5105 [Urine:2355; Emesis/NG output:2750] Intake/Output from this shift: I/O this shift: In: 3537 [P.O.:2372; I.V.:1115; IV Piggyback:50] Out: 1375 [Urine:475; Emesis/NG output:900]  Labs:  Basename 02/22/11 0021 02/21/11 1633 02/21/11 1546  WBC 9.9 -- 9.9  HGB 10.7* -- 11.0*  PLT 255 -- 252  LABCREA -- 70.91 --  CREATININE -- -- 0.65  CRCLEARANCE -- -- --    Microbiology: Recent Results (from the past 720 hour(s))  CULTURE, BETA STREP (GROUP B ONLY)     Status: Normal   Collection Time   02/09/11  9:03 AM      Component Value Range Status Comment   Organism ID, Bacteria NO GROUP B STREP (S.AGALACTIAE) ISOLATED   Final     Medical History: Past Medical History  Diagnosis Date  . Obesity   . Obesity   . Asthma     Medications: Ampicillin 2 grams IVPB every 6 hours. Routine labor medication orders.   Assessment: This patient is a 30 YO  [id number] at [redacted]weeks EGA admitted for induction of labor; now with a Max temperature of 102 and presumed chorioamnionitis.   Goal of Therapy: Gentamicin peaks: 6-23mcg/ml and troughs <0.53mcg/ml.   Plan: Gentamicin 160mg  IV loading dose, then 150mg  maintenance dose every 8 hours. Serum gentamicin levels will be checked as indicated by therapeutic response and duration of therapy.   Arelia Sneddon 02/23/2011,5:11 AM

## 2011-02-23 NOTE — Progress Notes (Signed)
[name] Andrade is a 30 y.o. G4P3003 at [redacted]w[redacted]d admitted for induction of labor due to Pre-eclamptic toxemia of pregnancy..  Subjective: Occasional pressure with ctx  Objective: BP 173/93  Pulse 125  Temp(Src) 98.1 F (36.7 C) (Axillary)  Resp 20  Ht 5\' 1"  (1.549 m)  Wt 102.694 kg (226 lb 6.4 oz)  BMI 42.78 kg/m2  LMP 05/31/2010 I/O last 3 completed shifts: In: 5782.4 [P.O.:3220; I.V.:2562.4] Out: 5380 [Urine:3130; Emesis/NG output:2250] I/O this shift: In: 1877.9 [P.O.:1252; I.V.:625.9] Out: 1290 [Urine:390; Emesis/NG output:900]  FHT:  FHR: 150 bpm, variability: minimal ,  accelerations:  Abscent,  decelerations:  Present variable with spontaneous return to BL UC:   irregular, every 2-3 minutes, MVUs 190-240, pitocin at 20 mu SVE:   Dilation: 7.5 Effacement (%): 80 Station: -1 Exam by:: Bennye Alm, RN/A. Earlene Plater, RN  Labs: Lab Results  Component Value Date   WBC 9.9 02/22/2011   HGB 10.7* 02/22/2011   HCT 31.8* 02/22/2011   MCV 86.2 02/22/2011   PLT 255 02/22/2011    Assessment / Plan: Induction of labor due to preeclampsia,  progressing well on pitocin IV hydralazine 10 mg now for hypertension  Labor: Progressing normally Preeclampsia:  on magnesium sulfate Fetal Wellbeing:  Category II Pain Control:  Epidural I/D:  n/a Anticipated MOD:  NSVD  [name] Andrade E. 02/23/2011, 1:50 AM

## 2011-02-23 NOTE — Progress Notes (Signed)
Dr. Emelda Fear called to get update on patient.  Informed of FHR, UCs, and decreased urine output.  New orders received to increase total fluids to 177mL/hr.

## 2011-02-23 NOTE — Progress Notes (Signed)
UR chart review completed.  

## 2011-02-23 NOTE — Progress Notes (Signed)
Called by nurse for post partum bleeding.  Pt stood up to use restroom had "gush of blood" with clots and continued to pass clots.  Pt denies dizziness, nausea.    BP 127/84  Pulse 89  Temp(Src) 97.8 F (36.6 C) (Oral)  Resp 20  Ht 5\' 1"  (1.549 m)  Wt 232 lb 6.4 oz (105.416 kg)  BMI 43.91 kg/m2  SpO2 99%  LMP 05/31/2010  Breastfeeding? Unknown  Physical Exam: General: NAD Cardiac: RRR, pulses 2+ peripherally Pulm: clear to auscultation bilaterally GI: abdomen soft, fundus firm  A/P: 29yo PPD #0 with with history of asthma and pre-eclampsia on Mag with PP hemorrhage Will give cytotec PR now. Continue to monitor, if does not improve, will consider SSE

## 2011-02-23 NOTE — Anesthesia Postprocedure Evaluation (Signed)
  Anesthesia Post-op Note  Patient: [name] Andrade  Patient's cardiopulmonary status is stable Patient's level of consciousness: sedate but responsive verbally Pain and nausea are all reasonably controlled No anesthetic complications apparent at this time No follow up care necessary at this time

## 2011-02-23 NOTE — Progress Notes (Signed)
Notified of exam and increased BPs.  New order received for hydralazine 10mg  IV now.

## 2011-02-23 NOTE — Progress Notes (Signed)
  [name] Andrade is a 30 y.o. G4P3003 at [redacted]w[redacted]d  admitted for induction of labor due to Pre-eclamptic toxemia of pregnancy..  Subjective:   Objective: BP 123/64  Pulse 127  Temp(Src) 100.4 F (38 C) (Axillary)  Resp 20  Ht 5\' 1"  (1.549 m)  Wt 226 lb 6.4 oz (102.694 kg)  BMI 42.78 kg/m2  LMP 05/31/2010 I/O last 3 completed shifts: In: 5782.4 [P.O.:3220; I.V.:2562.4] Out: 5380 [Urine:3130; Emesis/NG output:2250] I/O this shift: In: 3663.6 [P.O.:2372; I.V.:1241.6; IV Piggyback:50] Out: 1375 [Urine:475; Emesis/NG output:900]  FHT:  FHR: 150 bpm, variability: moderate,  accelerations:  Abscent,  decelerations:  Present variables and earlies UC:   regular, every 2-4 minutes SVE:   Dilation: rim, Effacement: 100%, Station: 0 Labs: Lab Results  Component Value Date   WBC 9.9 02/22/2011   HGB 10.7* 02/22/2011   HCT 31.8* 02/22/2011   MCV 86.2 02/22/2011   PLT 255 02/22/2011    Assessment / Plan: Induction of labor due to preeclampsia,  progressing well on pitocin  Labor: On pitocin, 25mU/min.  Preeclampsia:  on magnesium sulfate, no evidence of mag toxicity. Decreasing urine output. Continue monitoring.  Fetal Wellbeing:  Category II Pain Control:  Epidural I/D:  on amp and gent. Anticipated MOD:  NSVD  [name] Andrade 02/23/2011, 6:05 AM

## 2011-02-24 ENCOUNTER — Other Ambulatory Visit: Payer: Self-pay | Admitting: Obstetrics and Gynecology

## 2011-02-24 ENCOUNTER — Encounter (HOSPITAL_COMMUNITY): Payer: Self-pay

## 2011-02-24 ENCOUNTER — Encounter (HOSPITAL_COMMUNITY)
Admission: AD | Disposition: A | Discharge status: Home / Self Care | Payer: Self-pay | Source: Ambulatory Visit | Attending: Family Medicine

## 2011-02-24 ENCOUNTER — Inpatient Hospital Stay (HOSPITAL_COMMUNITY): Payer: Medicaid Other

## 2011-02-24 DIAGNOSIS — Z302 Encounter for sterilization: Secondary | ICD-10-CM

## 2011-02-24 HISTORY — PX: TUBAL LIGATION: SHX77

## 2011-02-24 LAB — CBC
HCT: 26.1 % — ABNORMAL LOW (ref 36.0–46.0)
Hemoglobin: 8.7 g/dL — ABNORMAL LOW (ref 12.0–15.0)
MCH: 28.7 pg (ref 26.0–34.0)
MCHC: 33.3 g/dL (ref 30.0–36.0)
MCV: 86.1 fL (ref 78.0–100.0)

## 2011-02-24 SURGERY — LIGATION, FALLOPIAN TUBE, POSTPARTUM
Anesthesia: Epidural | Laterality: Bilateral | Wound class: Clean

## 2011-02-24 MED ORDER — KETOROLAC TROMETHAMINE 30 MG/ML IJ SOLN
15.0000 mg | Freq: Once | INTRAMUSCULAR | Status: AC | PRN
  Administered 2011-02-24: 30 mg via INTRAVENOUS

## 2011-02-24 MED ORDER — PRENATAL PLUS 27-1 MG PO TABS
1.0000 | ORAL_TABLET | Freq: Every day | ORAL | Status: DC
  Administered 2011-02-24: 1 via ORAL

## 2011-02-24 MED ORDER — SIMETHICONE 80 MG PO CHEW: 80.0000 mg | CHEWABLE_TABLET | ORAL | Status: DC | PRN

## 2011-02-24 MED ORDER — KETOROLAC TROMETHAMINE 30 MG/ML IJ SOLN
INTRAMUSCULAR | Status: AC
  Administered 2011-02-24: 30 mg via INTRAVENOUS
  Filled 2011-02-24: qty 1

## 2011-02-24 MED ORDER — BENZOCAINE-MENTHOL 20-0.5 % EX AERO: 1.0000 "application " | INHALATION_SPRAY | CUTANEOUS | Status: DC | PRN

## 2011-02-24 MED ORDER — FENTANYL CITRATE 0.05 MG/ML IJ SOLN
INTRAMUSCULAR | Status: AC
  Administered 2011-02-24: 50 ug via INTRAVENOUS
  Filled 2011-02-24: qty 2

## 2011-02-24 MED ORDER — SENNOSIDES-DOCUSATE SODIUM 8.6-50 MG PO TABS: 2.0000 | ORAL_TABLET | Freq: Every day | ORAL | Status: DC

## 2011-02-24 MED ORDER — SODIUM BICARBONATE 8.4 % IV SOLN
INTRAVENOUS | Status: AC
  Filled 2011-02-24: qty 50

## 2011-02-24 MED ORDER — MIDAZOLAM HCL 2 MG/2ML IJ SOLN
INTRAMUSCULAR | Status: AC
  Filled 2011-02-24: qty 2

## 2011-02-24 MED ORDER — DIBUCAINE 1 % RE OINT: 1.0000 "application " | TOPICAL_OINTMENT | RECTAL | Status: DC | PRN

## 2011-02-24 MED ORDER — FERROUS SULFATE 325 (65 FE) MG PO TABS
325.0000 mg | ORAL_TABLET | Freq: Two times a day (BID) | ORAL | Status: DC
  Administered 2011-02-24 – 2011-02-25 (×2): 325 mg via ORAL
  Filled 2011-02-24 (×2): qty 1

## 2011-02-24 MED ORDER — FENTANYL CITRATE 0.05 MG/ML IJ SOLN
INTRAMUSCULAR | Status: AC
  Filled 2011-02-24: qty 2

## 2011-02-24 MED ORDER — IBUPROFEN 600 MG PO TABS
600.0000 mg | ORAL_TABLET | Freq: Four times a day (QID) | ORAL | Status: DC | PRN
  Administered 2011-02-24 – 2011-02-25 (×2): 600 mg via ORAL
  Filled 2011-02-24 (×2): qty 1

## 2011-02-24 MED ORDER — LACTATED RINGERS IV SOLN: INTRAVENOUS | Status: DC

## 2011-02-24 MED ORDER — METOCLOPRAMIDE HCL 10 MG PO TABS
10.0000 mg | ORAL_TABLET | Freq: Once | ORAL | Status: AC
  Administered 2011-02-24: 10 mg via ORAL
  Filled 2011-02-24: qty 1

## 2011-02-24 MED ORDER — FENTANYL CITRATE 0.05 MG/ML IJ SOLN
25.0000 ug | INTRAMUSCULAR | Status: DC | PRN
  Administered 2011-02-24: 50 ug via INTRAVENOUS

## 2011-02-24 MED ORDER — ACETAMINOPHEN 325 MG PO TABS: 325.0000 mg | ORAL_TABLET | ORAL | Status: DC | PRN

## 2011-02-24 MED ORDER — MORPHINE SULFATE 0.5 MG/ML IJ SOLN
INTRAMUSCULAR | Status: AC
  Filled 2011-02-24: qty 10

## 2011-02-24 MED ORDER — FAMOTIDINE 20 MG PO TABS
40.0000 mg | ORAL_TABLET | Freq: Once | ORAL | Status: AC
  Administered 2011-02-24: 40 mg via ORAL
  Filled 2011-02-24: qty 2

## 2011-02-24 MED ORDER — LANOLIN HYDROUS EX OINT: TOPICAL_OINTMENT | CUTANEOUS | Status: DC | PRN

## 2011-02-24 MED ORDER — DIPHENHYDRAMINE HCL 25 MG PO CAPS: 25.0000 mg | ORAL_CAPSULE | Freq: Four times a day (QID) | ORAL | Status: DC | PRN

## 2011-02-24 MED ORDER — ONDANSETRON HCL 4 MG/2ML IJ SOLN
INTRAMUSCULAR | Status: AC
  Filled 2011-02-24: qty 2

## 2011-02-24 MED ORDER — ZOLPIDEM TARTRATE 5 MG PO TABS: 5.0000 mg | ORAL_TABLET | Freq: Every evening | ORAL | Status: DC | PRN

## 2011-02-24 MED ORDER — IBUPROFEN 600 MG PO TABS
600.0000 mg | ORAL_TABLET | Freq: Four times a day (QID) | ORAL | Status: DC
  Administered 2011-02-24: 600 mg via ORAL

## 2011-02-24 MED ORDER — OXYCODONE-ACETAMINOPHEN 5-325 MG PO TABS: 1.0000 | ORAL_TABLET | ORAL | Status: DC | PRN

## 2011-02-24 MED ORDER — FENTANYL CITRATE 0.05 MG/ML IJ SOLN
INTRAMUSCULAR | Status: DC | PRN
  Administered 2011-02-24: 25 ug via INTRAVENOUS
  Administered 2011-02-24: 50 ug via INTRAVENOUS
  Administered 2011-02-24: 25 ug via INTRAVENOUS

## 2011-02-24 MED ORDER — OXYCODONE-ACETAMINOPHEN 5-325 MG PO TABS
1.0000 | ORAL_TABLET | ORAL | Status: DC | PRN
  Administered 2011-02-24 – 2011-02-25 (×3): 1 via ORAL
  Filled 2011-02-24 (×3): qty 1

## 2011-02-24 MED ORDER — TETANUS-DIPHTH-ACELL PERTUSSIS 5-2.5-18.5 LF-MCG/0.5 IM SUSP: 0.5000 mL | Freq: Once | INTRAMUSCULAR | Status: DC

## 2011-02-24 MED ORDER — ONDANSETRON HCL 4 MG/2ML IJ SOLN: 4.0000 mg | INTRAMUSCULAR | Status: DC | PRN

## 2011-02-24 MED ORDER — ACETAMINOPHEN 325 MG PO TABS: 650.0000 mg | ORAL_TABLET | ORAL | Status: DC | PRN

## 2011-02-24 MED ORDER — LIDOCAINE-EPINEPHRINE (PF) 2 %-1:200000 IJ SOLN
INTRAMUSCULAR | Status: AC
  Filled 2011-02-24: qty 20

## 2011-02-24 MED ORDER — WITCH HAZEL-GLYCERIN EX PADS: 1.0000 "application " | MEDICATED_PAD | CUTANEOUS | Status: DC | PRN

## 2011-02-24 MED ORDER — BUPIVACAINE HCL (PF) 0.25 % IJ SOLN
INTRAMUSCULAR | Status: DC | PRN
  Administered 2011-02-24: 9 mL

## 2011-02-24 MED ORDER — SODIUM BICARBONATE 8.4 % IV SOLN
INTRAVENOUS | Status: DC | PRN
  Administered 2011-02-24: 3 mL via EPIDURAL

## 2011-02-24 MED ORDER — PROMETHAZINE HCL 25 MG/ML IJ SOLN: 6.2500 mg | INTRAMUSCULAR | Status: DC | PRN

## 2011-02-24 MED ORDER — MIDAZOLAM HCL 5 MG/5ML IJ SOLN
INTRAMUSCULAR | Status: DC | PRN
  Administered 2011-02-24: 2 mg via INTRAVENOUS

## 2011-02-24 MED ORDER — ONDANSETRON HCL 4 MG PO TABS: 4.0000 mg | ORAL_TABLET | ORAL | Status: DC | PRN

## 2011-02-24 MED ORDER — ALBUTEROL SULFATE HFA 108 (90 BASE) MCG/ACT IN AERS
2.0000 | INHALATION_SPRAY | RESPIRATORY_TRACT | Status: DC | PRN
  Administered 2011-02-24: 2 via RESPIRATORY_TRACT
  Filled 2011-02-24: qty 6.7

## 2011-02-24 MED ORDER — ONDANSETRON HCL 4 MG/2ML IJ SOLN
INTRAMUSCULAR | Status: DC | PRN
  Administered 2011-02-24: 4 mg via INTRAVENOUS

## 2011-02-24 SURGICAL SUPPLY — 22 items
CHLORAPREP W/TINT 26ML (MISCELLANEOUS) ×2 IMPLANT
CONTAINER PREFILL 10% NBF 15ML (MISCELLANEOUS) ×4 IMPLANT
DRAPE UTILITY XL STRL (DRAPES) ×2 IMPLANT
DRSG COVADERM 4X6 (GAUZE/BANDAGES/DRESSINGS) ×2 IMPLANT
ELECT REM PT RETURN 9FT ADLT (ELECTROSURGICAL) ×2
ELECTRODE REM PT RTRN 9FT ADLT (ELECTROSURGICAL) ×1 IMPLANT
GLOVE BIOGEL PI IND STRL 6.5 (GLOVE) ×2 IMPLANT
GLOVE BIOGEL PI INDICATOR 6.5 (GLOVE) ×2
GLOVE SURG SS PI 6.0 STRL IVOR (GLOVE) ×2 IMPLANT
GOWN PREVENTION PLUS LG XLONG (DISPOSABLE) ×4 IMPLANT
NEEDLE HYPO 25X1 1.5 SAFETY (NEEDLE) IMPLANT
NS IRRIG 1000ML POUR BTL (IV SOLUTION) ×2 IMPLANT
PACK ABDOMINAL MINOR (CUSTOM PROCEDURE TRAY) ×2 IMPLANT
SPONGE LAP 4X18 X RAY DECT (DISPOSABLE) IMPLANT
SUT PLAIN 0 NONE (SUTURE) ×2 IMPLANT
SUT VIC AB 0 CT1 27 (SUTURE) ×1
SUT VIC AB 0 CT1 27XBRD ANBCTR (SUTURE) ×1 IMPLANT
SUT VIC AB 3-0 PS2 18 (SUTURE) ×2 IMPLANT
SYR CONTROL 10ML LL (SYRINGE) IMPLANT
TOWEL OR 17X24 6PK STRL BLUE (TOWEL DISPOSABLE) ×4 IMPLANT
TRAY FOLEY CATH 14FR (SET/KITS/TRAYS/PACK) ×2 IMPLANT
WATER STERILE IRR 1000ML POUR (IV SOLUTION) ×2 IMPLANT

## 2011-02-24 NOTE — Op Note (Addendum)
NAME@ 02/24/2011  PREOPERATIVE DIAGNOSIS:  Undesired fertility  POSTOPERATIVE DIAGNOSIS:  Undesired fertility  PROCEDURE:  Postpartum Bilateral Tubal Sterilization using Pomeroy method   Surgeon: Dr . Jolayne Panther  Assistant: Dr. Pennie Rushing  ANESTHESIA:  Epidural  COMPLICATIONS:  None immediate.  ESTIMATED BLOOD LOSS:  Less than 20cc.  FLUIDS: 750 cc LR.  URINE OUTPUT:  250 cc of clear urine.  INDICATIONS: 30 y.o. yo N5A2130  with undesired fertility,status post vaginal delivery, desires permanent sterilization. Risks and benefits of procedure discussed with patient including permanence of method, bleeding, infection, injury to surrounding organs and need for additional procedures. Risk failure of 0.5-1% with increased risk of ectopic gestation if pregnancy occurs was also discussed with patient.   FINDINGS:  Normal uterus, tubes, and ovaries.  Specimen: Portion of right and left fallopian tubes  Disposition of specimen: Pathology  TECHNIQUE: After informed consent was obtained, the patient was taken to the operating room where anesthesia was induced and found to be adequate. A small transverse, infraumbilical skin incision was made with the scalpel. This incision was carried down to the underlying layer of fascia. The fascia was grasped with Kocher clamps tented up and entered sharply with Mayo scissors. Underlying peritoneum was then identified tented up and entered sharply with Metzenbaum scissors. The fascia was tagged with 0 Vicryl. The patient's left fallopian tube was then identified, brought to the incision, and grasped with a Babcock clamp. The tube was then followed out to the fimbria. The Babcock clamp was then used to grasp the tube approximately 4 cm from the cornual region. A 3 cm segment of the tube was then ligated with free tie of plain gut suture, transected and excised. Good hemostasis was noted and the tube was returned to the abdomen. The right fallopian tube was then  identified to its fimbriated end, ligated, and a 3 cm segment excised in a similar fashion. Excellent hemostasis was noted, and the tube returned to the abdomen. The fascia was re-approximated with 0 Vicryl. The skin was closed in a subcuticular fashion with 3-0 Vicryl. Quarter percent Marcaine solution was then injected at the incision site. The patient tolerated the procedure well. Sponge, lap, and needle count were correct x2. The patient was taken to recovery room in stable condition.

## 2011-02-24 NOTE — Anesthesia Procedure Notes (Signed)
Epidural

## 2011-02-24 NOTE — Anesthesia Preprocedure Evaluation (Addendum)
Anesthesia Evaluation  Name, MR# and DOB Patient awake  General Assessment Comment  Reviewed: Allergy & Precautions, H&P  and Patient's Chart, lab work & pertinent test results  Airway Mallampati: III TM Distance: >3 FB Neck ROM: full    Dental No notable dental hx    Pulmonaryneg pulmonary ROS  asthma  clear to auscultation  pulmonary exam normal   Cardiovascular hypertension, regular Normal   Neuro/PsychNegative Neurological ROS Negative Psych ROS  GI/Hepatic/Renal negative GI ROS, negative Liver ROS, and negative Renal ROS (+)       Endo/Other  Negative Endocrine ROS (+)   Abdominal   Musculoskeletal  Hematology negative hematology ROS (+)   Peds  Reproductive/Obstetrics   Anesthesia Other Findings             Anesthesia Physical Anesthesia Plan  ASA: III  Anesthesia Plan: Epidural   Post-op Pain Management:    Induction:   Airway Management Planned:   Additional Equipment:   Intra-op Plan:   Post-operative Plan:   Informed Consent: I have reviewed the patients History and Physical, chart, labs and discussed the procedure including the risks, benefits and alternatives for the proposed anesthesia with the patient or authorized representative who has indicated his/her understanding and acceptance.     Plan Discussed with:   Anesthesia Plan Comments:         Anesthesia Quick Evaluation

## 2011-02-24 NOTE — Progress Notes (Signed)
30 y.o. yo [phone]  with undesired fertility,status post vaginal delivery, desires permanent sterilization. Risks and benefits of procedure discussed with patient including permanence of method, bleeding, infection, injury to surrounding organs and need for additional procedures. Risk failure of 0.5-1% with increased risk of ectopic gestation if pregnancy occurs was also discussed with patient. Patient verbalized understanding. All questions were answered. Consent signed.

## 2011-02-24 NOTE — Progress Notes (Signed)
Encounter addended by: Fanny Dance on: 02/24/2011  4:14 PM<BR>     Documentation filed: Notes Section

## 2011-02-24 NOTE — Anesthesia Postprocedure Evaluation (Signed)
Anesthesia Post Note  Patient: [name] Andrade  Procedure(s) Performed:  POST PARTUM TUBAL LIGATION  Anesthesia type: Epidural  Patient location: Mother/Baby  Post pain: Pain level controlled  Post assessment: Post-op Vital signs reviewed  Last Vitals:  Filed Vitals:   02/24/11 1115  BP:   Pulse: 89  Temp:   Resp: 20    Post vital signs: Reviewed  Level of consciousness: awake  Complications: No apparent anesthesia complications

## 2011-02-24 NOTE — Transfer of Care (Signed)
Immediate Anesthesia Transfer of Care Note  Patient: [name] Andrade  Procedure(s) Performed:  POST PARTUM TUBAL LIGATION  Patient Location: PACU  Anesthesia Type: Epidural  Level of Consciousness: awake and oriented  Airway & Oxygen Therapy: Patient Spontanous Breathing and Patient connected to nasal cannula oxygen  Post-op Assessment: Report given to PACU RN and Post -op Vital signs reviewed and stable  Post vital signs: Reviewed and stable  Complications: No apparent anesthesia complications

## 2011-02-24 NOTE — Addendum Note (Signed)
Addendum  created 02/24/11 0858 by Velna Hatchet   Modules edited:Orders, PRL Based Order Sets

## 2011-02-24 NOTE — Anesthesia Postprocedure Evaluation (Signed)
  Anesthesia Post-op Note  Patient: [name] Andrade  Procedure(s) Performed:  POST PARTUM TUBAL LIGATION  Patient Location: Mother/Baby  Anesthesia Type: Epidural  Level of Consciousness: awake, alert  and oriented  Airway and Oxygen Therapy: Patient Spontanous Breathing  Post-op Pain: mild  Post-op Assessment: Patient's Cardiovascular Status Stable and Respiratory Function Stable  Post-op Vital Signs: Reviewed and stable  Complications: No apparent anesthesia complications

## 2011-02-24 NOTE — Progress Notes (Signed)
Post Partum Day1 Subjective: no complaints, up ad lib, voiding and pain well-controlled  Objective: Blood pressure 139/94, pulse 79, temperature 97.4 F (36.3 C), temperature source Oral, resp. rate 25, height 5\' 1"  (1.549 m), weight 227 lb 6.4 oz (103.148 kg), last menstrual period 05/31/2010, SpO2 99.00%, unknown if currently breastfeeding.  Physical Exam:  General: alert, cooperative and no distress Lochia: appropriate, amount comparable to normal period Uterine Fundus: firm Incision: n/a DVT Evaluation: No evidence of DVT seen on physical exam.   Basename 02/24/11 1105 02/23/11 1632  HGB 8.7* 9.4*  HCT 26.1* 28.2*    Assessment/Plan: Plan for discharge tomorrow and Contraception s/p BTL   LOS: 3 days   DE LA CRUZ,Nelvin Tomb 02/24/2011, 12:39 PM

## 2011-02-25 MED ORDER — OXYCODONE-ACETAMINOPHEN 5-325 MG PO TABS: 1.0000 | ORAL_TABLET | ORAL | Status: AC | PRN

## 2011-02-25 MED ORDER — FERROUS SULFATE 325 (65 FE) MG PO TABS: 325.0000 mg | ORAL_TABLET | Freq: Two times a day (BID) | ORAL | Status: DC

## 2011-02-25 MED ORDER — DOCUSATE SODIUM 100 MG PO CAPS: 100.0000 mg | ORAL_CAPSULE | Freq: Two times a day (BID) | ORAL | Status: AC

## 2011-02-25 MED ORDER — IBUPROFEN 600 MG PO TABS: 600.0000 mg | ORAL_TABLET | Freq: Four times a day (QID) | ORAL | Status: AC | PRN

## 2011-02-25 NOTE — Discharge Summary (Signed)
  Obstetric Discharge Summary Reason for Admission: induction of labor and pre-eclampsia Prenatal Procedures: ultrasound Intrapartum Procedures: spontaneous vaginal delivery Postpartum Procedures: P.P. tubal ligation Complications-Operative and Postpartum: hemorrhage resolved with cytotec Hemoglobin  Date Value Range Status  02/24/2011 8.7* 12.0-15.0 (g/dL) Final     HCT  Date Value Range Status  02/24/2011 26.1* 36.0-46.0 (%) Final    Discharge Diagnoses: Term Pregnancy-delivered, Preelampsia and post partum hemorrhage  Discharge Information: Date: 02/25/2011 Activity: pelvic rest Diet: routine Medications: Ibuprophen, Colace, Iron and Percocet Condition: stable Instructions: refer to practice specific booklet Discharge to: home Follow-up Information    Follow up with [facility] in 1 week. (blood pressure check)    Contact information:   [address] [place] [address]       Follow up with [facility] in 6 weeks. (post partum check)    Contact information:   [address] [place] [address]          Newborn Data: Live born female  Birth Weight: 6 lb 15.2 oz (3152 g) APGAR: 1, 3  Infant in NICU  Lindaann Slough. 02/25/2011, 12:31 PM

## 2011-02-25 NOTE — Progress Notes (Signed)
.   Post Partum Day 2, POD 1 Subjective: up ad lib, voiding, tolerating PO and + flatus, mild abdominal tenderness, controlled with pain meds, lochia less than menses  Objective: Blood pressure 141/85, pulse 77, temperature 98.8 F (37.1 C), temperature source Oral, resp. rate 19, height 5\' 1"  (1.549 m), weight 227 lb 6.4 oz (103.148 kg), last menstrual period 05/31/2010, SpO2 96.00%, unknown if currently breastfeeding.  Physical Exam:  General: alert, cooperative and no distress Lochia: appropriate Uterine Fundus: firm, at umbilicus Incision: healing well, clean, dry intact DVT Evaluation: No evidence of DVT seen on physical exam. Negative Homan's sign.   Basename 02/24/11 1105 02/23/11 1632  HGB 8.7* 9.4*  HCT 26.1* 28.2*    Assessment/Plan: Discharge home and Contraception s/p BTL Will follow up in 1 week for BP check and 4-6wks for post partum check   LOS: 4 days   Lindaann Slough. 02/25/2011, 12:24 PM

## 2011-02-26 LAB — MRSA CULTURE

## 2011-02-28 ENCOUNTER — Encounter: Payer: Medicaid Other | Admitting: Family Medicine

## 2011-03-23 ENCOUNTER — Telehealth: Payer: Self-pay | Admitting: Family Medicine

## 2011-03-23 NOTE — Telephone Encounter (Signed)
She would like to know if when her tubes where tide, were they burned or clipped.

## 2011-03-23 NOTE — Telephone Encounter (Signed)
Called pt and advised to call [facility] .Arlyss Repress

## 2011-04-17 LAB — URINE MICROSCOPIC-ADD ON

## 2011-04-17 LAB — URINALYSIS, ROUTINE W REFLEX MICROSCOPIC
Bilirubin Urine: NEGATIVE
Glucose, UA: NEGATIVE
Ketones, ur: NEGATIVE
Protein, ur: NEGATIVE

## 2011-04-19 ENCOUNTER — Encounter (HOSPITAL_COMMUNITY): Payer: Self-pay | Admitting: Obstetrics and Gynecology

## 2011-04-20 LAB — CBC
Hemoglobin: 11 — ABNORMAL LOW
RBC: 3.78 — ABNORMAL LOW

## 2011-04-20 LAB — RPR: RPR Ser Ql: NONREACTIVE

## 2011-04-25 ENCOUNTER — Ambulatory Visit (INDEPENDENT_AMBULATORY_CARE_PROVIDER_SITE_OTHER): Payer: Medicaid Other | Admitting: *Deleted

## 2011-04-25 DIAGNOSIS — Z23 Encounter for immunization: Secondary | ICD-10-CM

## 2011-06-17 IMAGING — CT CT ANGIO CHEST
2 of 6 series · 19 of 36 positions shown · IV contrast (APPLIED)
Comparison: None

CLINICAL DATA: Shortness of breath, cough, fever, chest pain,
chest tightness, sore throat, chills, history smoking, question
pulmonary embolism

CT ANGIOGRAPHY CHEST WITH CONTRAST
TECHNIQUE: Multidetector CT imaging of the chest was performed
using the standard protocol during bolus administration of
intravenous contrast.  Multiplanar CT image reconstructions
including MIPs were obtained to evaluate the vascular anatomy.
Breast shield utilized.
Contrast:  100 ml [id number] IV

[Series 8: pe thins @ 1mm · axial · 0.80mm/px · z∈[-331,-73]mm · 18 of 288 slices shown]
[im 15/288  lung]
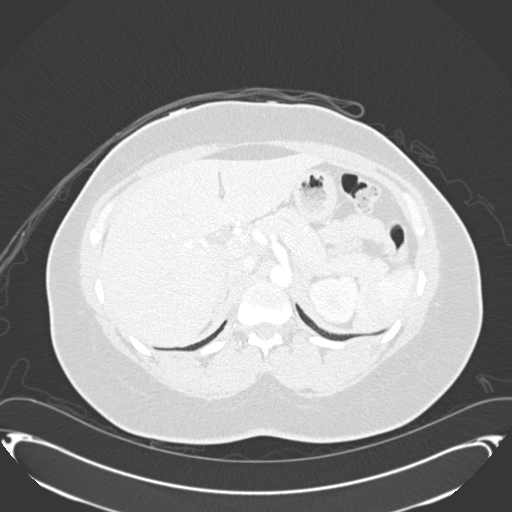
[im 29/288  mediastinal]
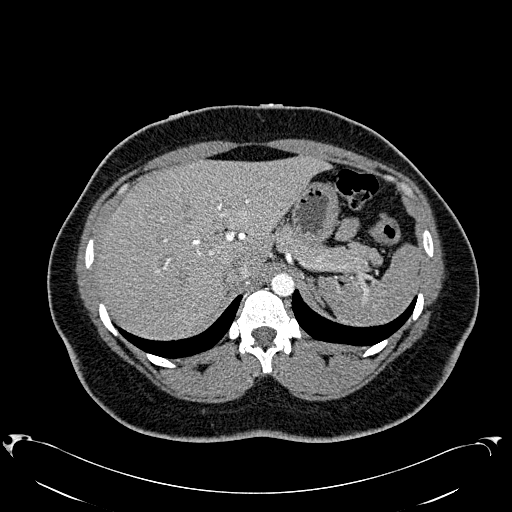
[im 44/288  lung]
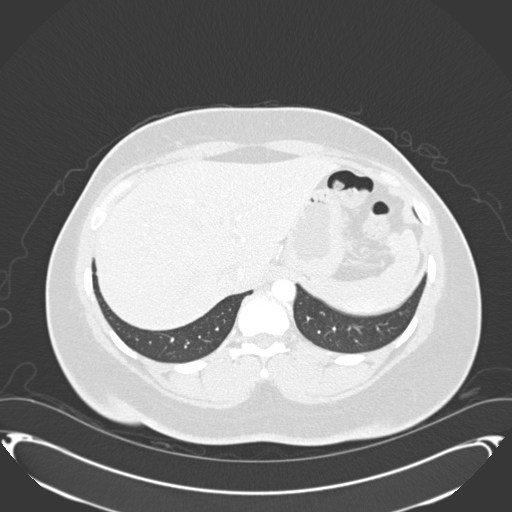
[im 58/288  mediastinal]
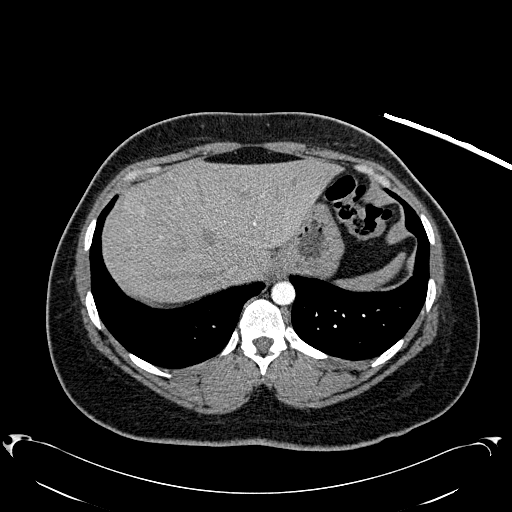
[im 72/288  lung]
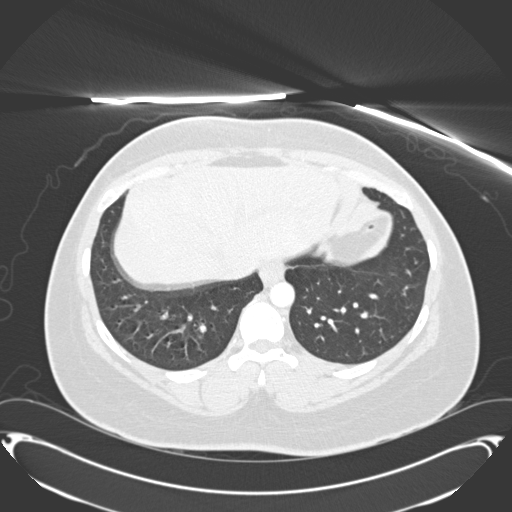
[im 87/288  mediastinal]
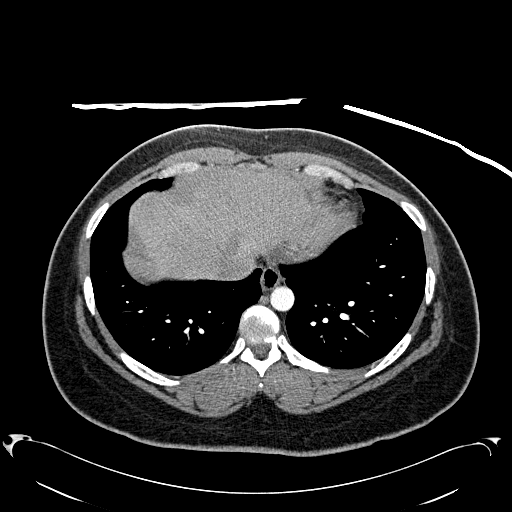
[im 101/288  lung]
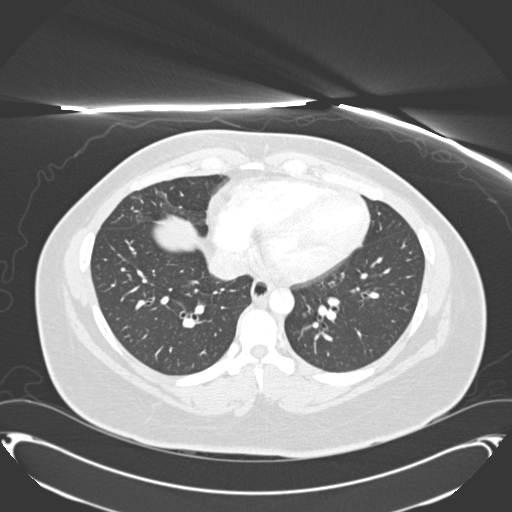
[im 115/288  mediastinal]
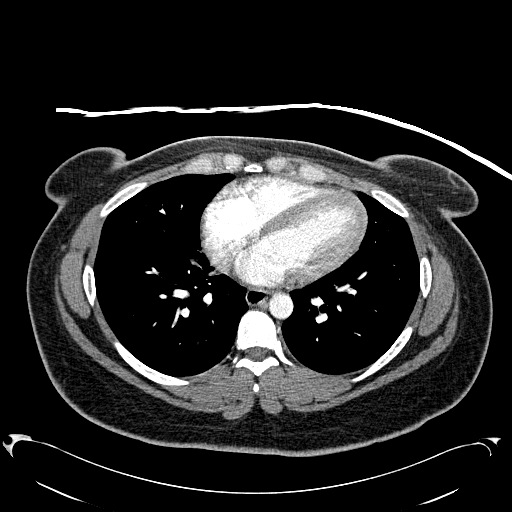
[im 130/288  lung]
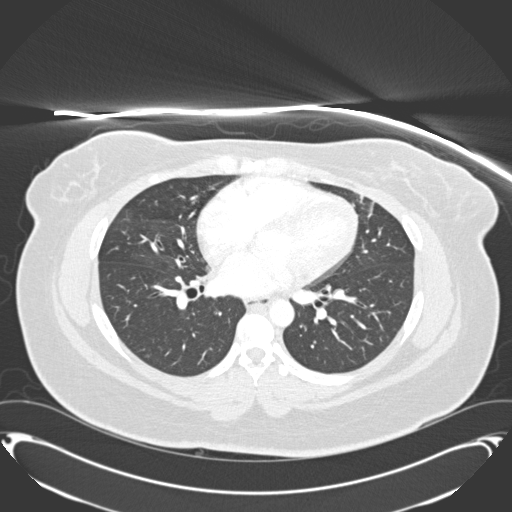
[im 158/288  mediastinal]
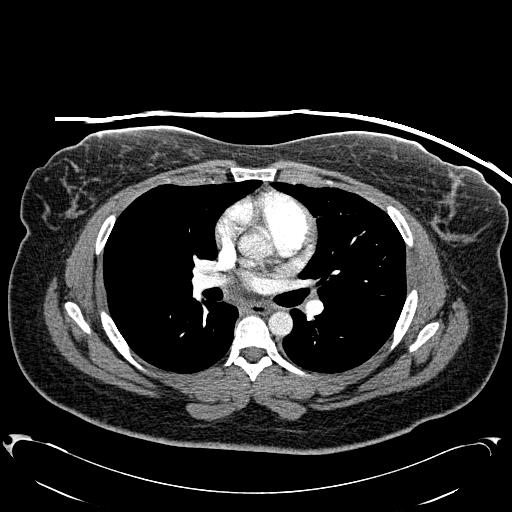
[im 173/288  lung]
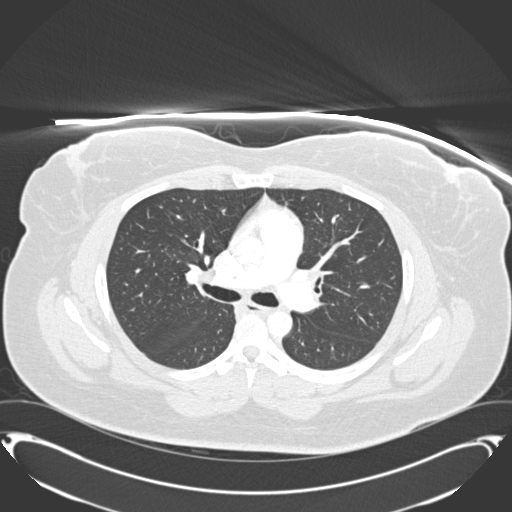
[im 187/288  mediastinal]
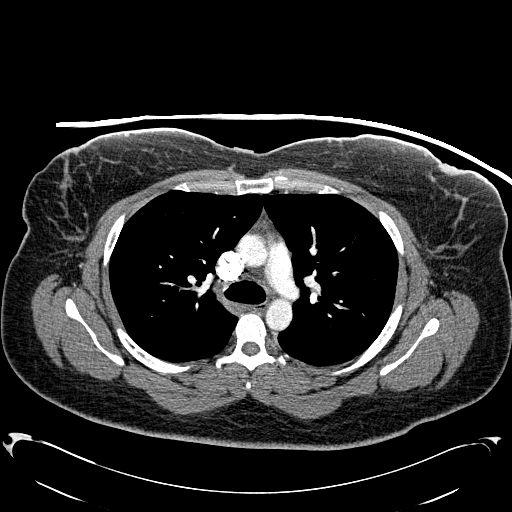
[im 201/288  lung]
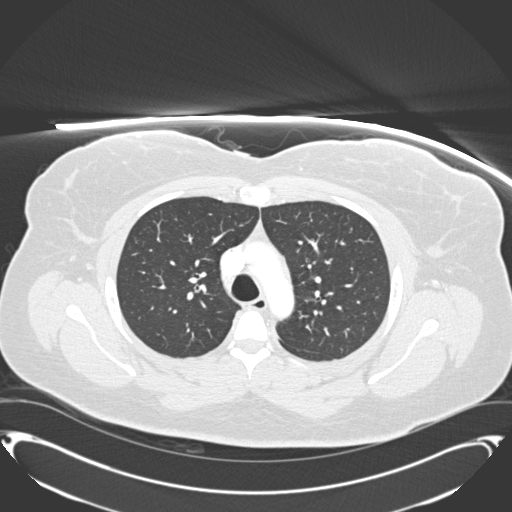
[im 216/288  mediastinal]
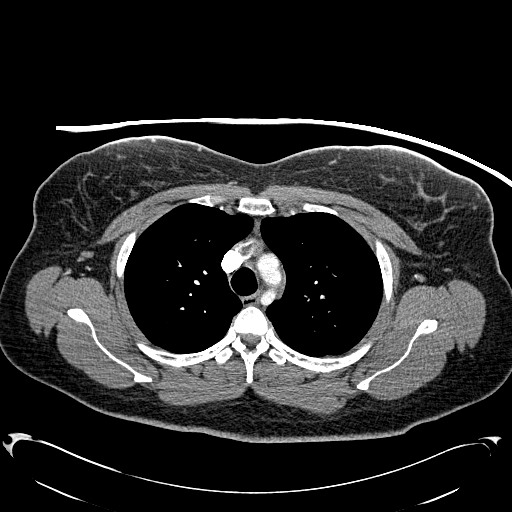
[im 230/288  lung]
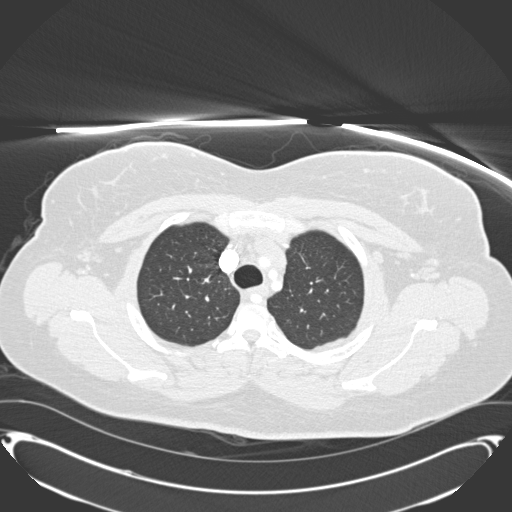
[im 244/288  mediastinal]
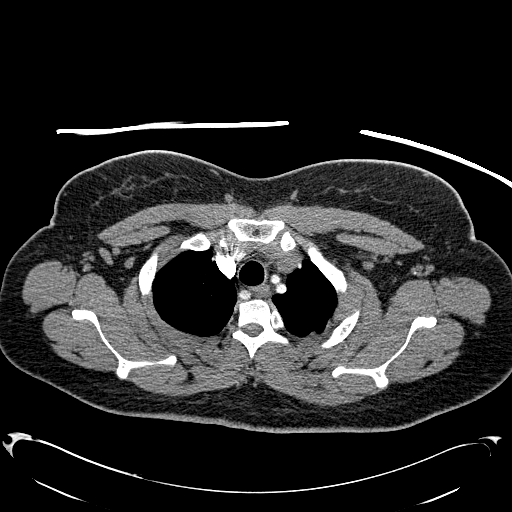
[im 259/288  lung]
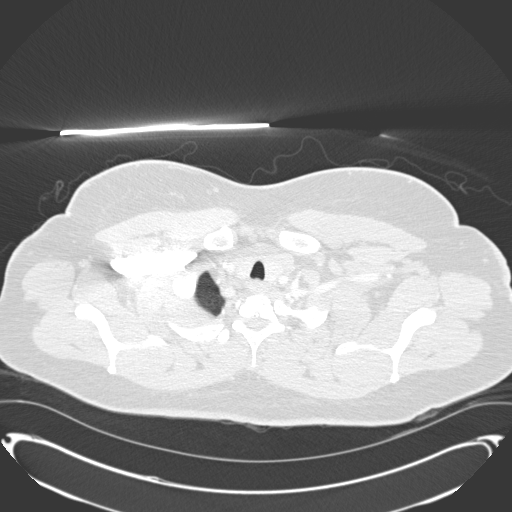
[im 273/288  mediastinal]
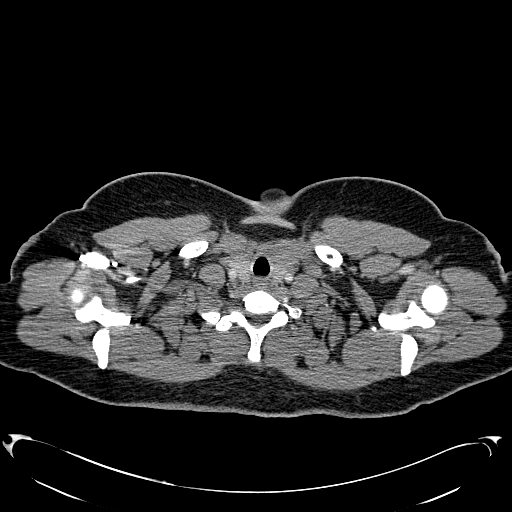

[Series 602: mpr coronals · coronal · 0.80mm/px · 1 of 119 slices shown]
[im 60/119  mediastinal]
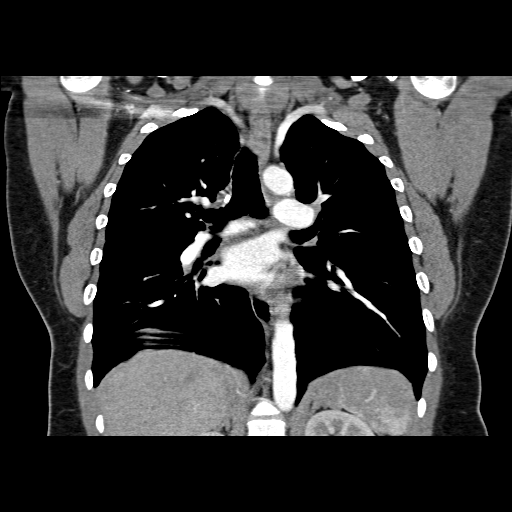

[19 of 36 positions shown; findings below may reference images not displayed]

FINDINGS: Aorta normal caliber without aneurysm or dissection.
Pulmonary arteries patent.
No evidence pulmonary embolism.
Numerous but normal-sized axillary lymph nodes bilaterally.
No mediastinal hilar adenopathy.
Visualized portion of upper abdomen unremarkable.
Minimal atelectasis base of right middle lobe.
Mild infiltrate lingula.
Remaining lungs clear.
No pleural effusion or pneumothorax.
Osseous structures unremarkable.

Review of the MIP images confirms the above findings.
IMPRESSION: Mild lingular infiltrate.
Minimal atelectasis base of right middle lobe.
No evidence pulmonary embolism.

## 2011-12-26 IMAGING — US US OB DETAIL+14 WK
1 series · 12 of 28 positions shown · non-contrast
Comparison: none

[Series 1: us ob detail +14 wk · 111 acquisitions, 12 frames shown]
[im 5/111]
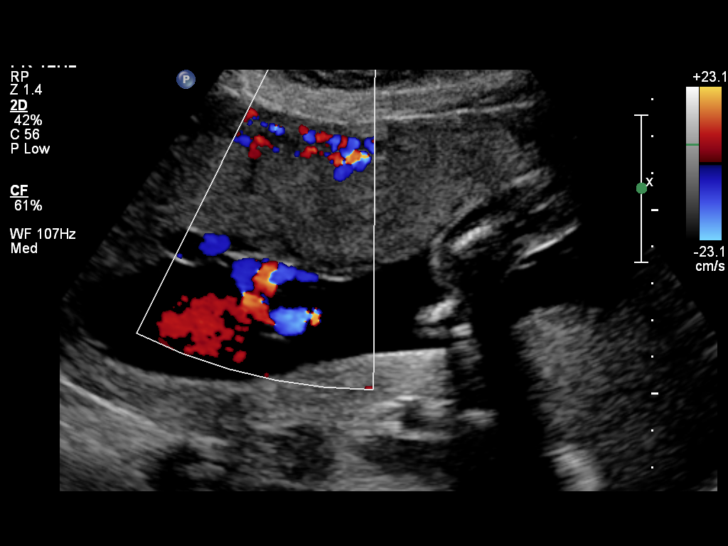
[im 13/111]
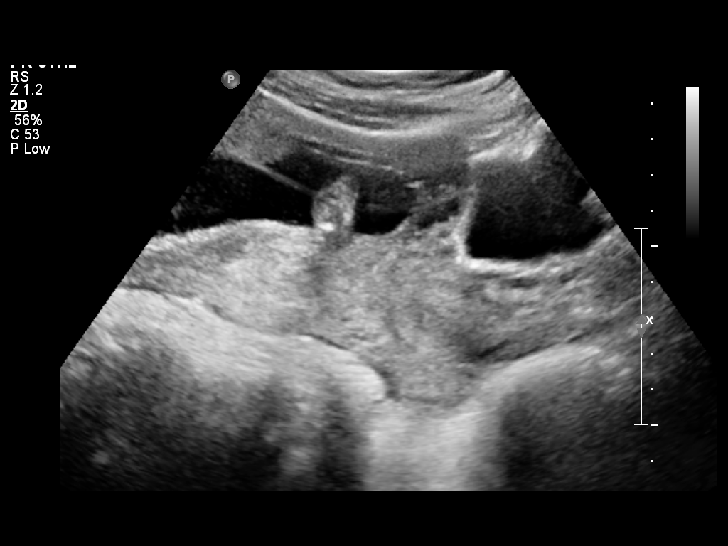
[im 21/111]
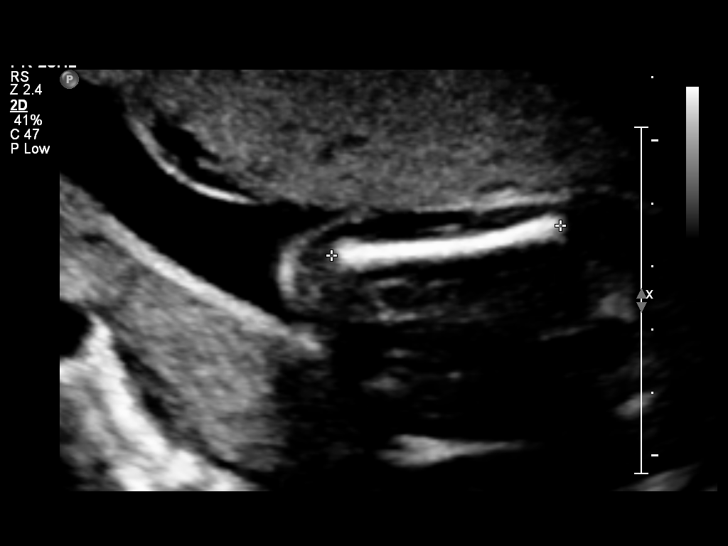
[im 33/111]
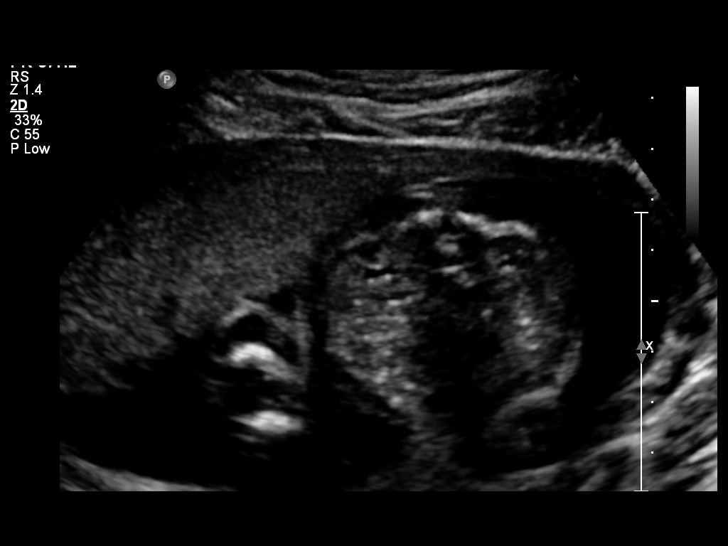
[im 41/111]
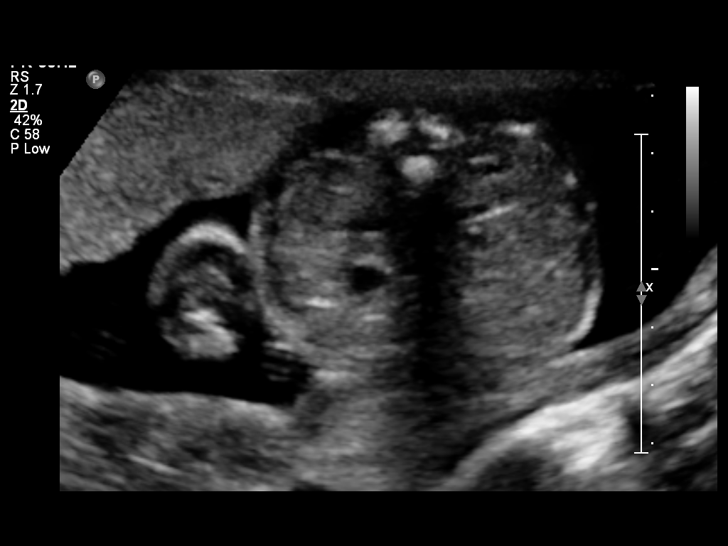
[im 49/111]
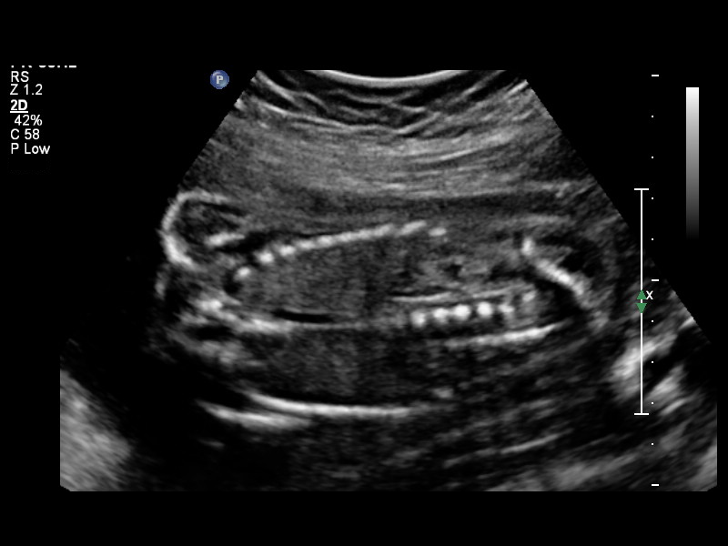
[im 62/111]
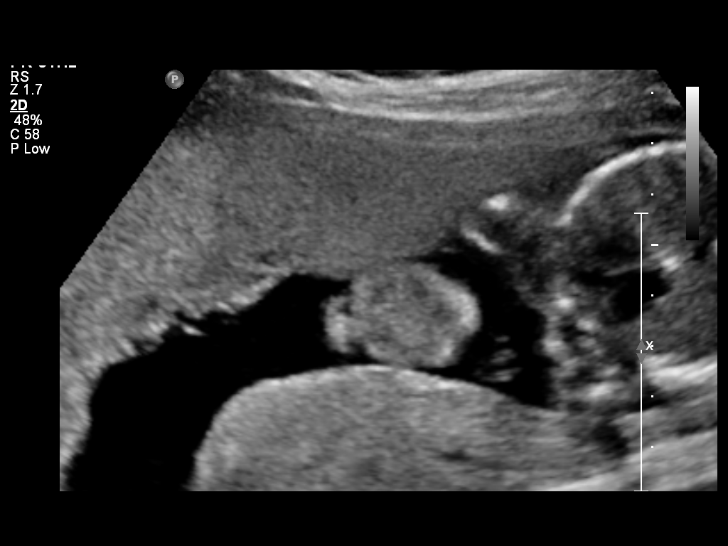
[im 70/111]
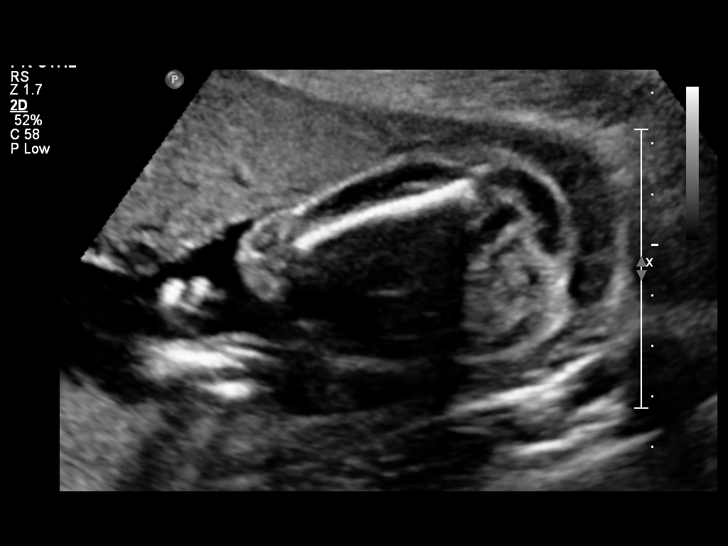
[im 78/111]
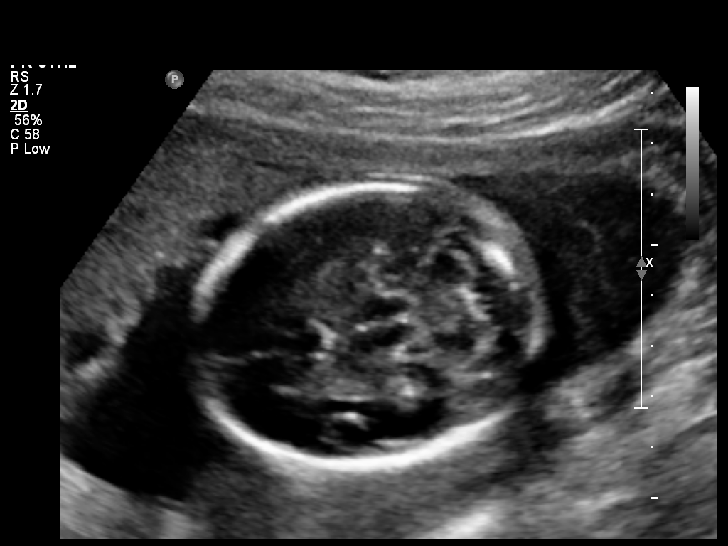
[im 90/111]
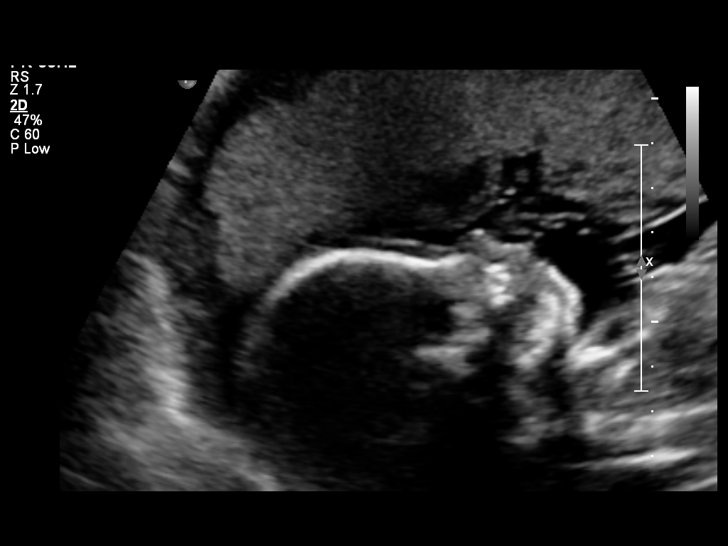
[im 98/111]
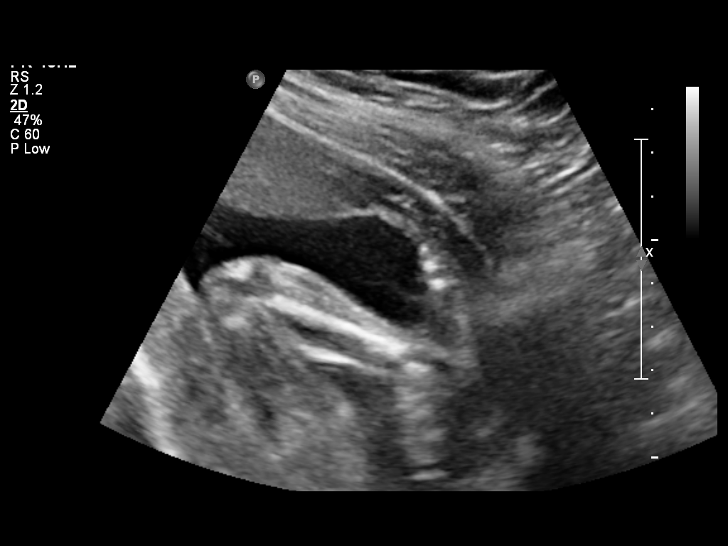
[im 106/111]
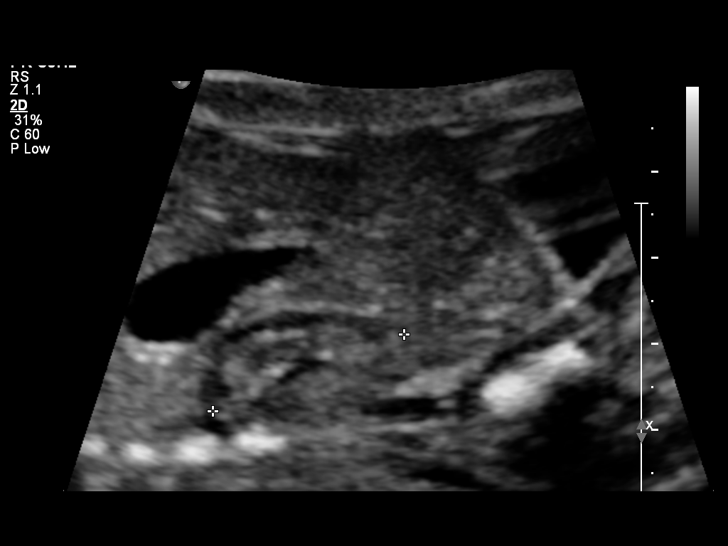

[12 of 28 positions shown; findings below may reference images not displayed]

OBSTETRICS REPORT
                      (Signed Final 10/31/2010 [DATE])

 Order#:         [id number]
Procedures

 US OB DETAIL + 14 WK                                  76811.0
Indications

 Detailed fetal anatomic survey
 Hypertension - Gestational
Fetal Evaluation

 Fetal Heart Rate:  142                          bpm
 Cardiac Activity:  Observed
 Presentation:      Breech
 Placenta:          Anterior, above cervical os
 P. Cord            Visualized
 Insertion:

 Amniotic Fluid
 AFI FV:      Subjectively within normal limits
                                             Larg Pckt:     5.6  cm
Biometry

 BPD:     54.4  mm     G. Age:  22w 4d                CI:        68.83   70 - 86
                                                      FL/HC:      17.6   18.4 -

 HC:     209.5  mm     G. Age:  23w 0d       83  %    HC/AC:      1.21   1.06 -

 AC:     173.4  mm     G. Age:  22w 2d       56  %    FL/BPD:     67.8   71 - 87
 FL:      36.9  mm     G. Age:  21w 5d       36  %    FL/AC:      21.3   20 - 24
 HUM:     36.5  mm     G. Age:  22w 5d       70  %
 CER:       24  mm     G. Age:  22w 0d       54  %

 Est. FW:     480  gm      1 lb 1 oz     52  %
Gestational Age

 LMP:           21w 6d        Date:  05/31/10                 EDD:   03/07/11
 U/S Today:     22w 3d                                        EDD:   03/03/11
 Best:          21w 6d     Det. By:  LMP  (05/31/10)          EDD:   03/07/11
Anatomy
 Cranium:           Appears normal      Aortic Arch:       Appears normal
 Fetal Cavum:       Appears normal      Ductal Arch:       Appears normal
 Ventricles:        Appears normal      Diaphragm:         Appears normal
 Choroid Plexus:    Appears normal      Stomach:           Appears
                                                           normal, left
                                                           sided
 Cerebellum:        Appears normal      Abdomen:           Appears normal
 Posterior Fossa:   Appears normal      Abdominal Wall:    Appears nml
                                                           (cord insert,
                                                           abd wall)
 Nuchal Fold:       Not applicable      Cord Vessels:      Appears normal
                    (>20 wks GA)                           (3 vessel cord)
 Face:              Appears normal      Kidneys:           Appear normal
                    (lips/profile/orbit
                    s)
 Heart:             Appears normal      Bladder:           Appears normal
                    (4 chamber &
                    axis)
 RVOT:              Appears normal      Spine:             Appears normal
 LVOT:              Appears normal      Limbs:             Appears normal
                                                           (hands, ankles,
                                                           feet)

 Other:     Fetus appears to be a male. Heels and 5th digit
            visualized.
Cervix Uterus Adnexa

 Cervical Length:    4.3      cm

 Cervix:       Normal appearance by transabdominal scan.
 Uterus:       No abnormality visualized.
 Left Ovary:    Within normal limits. Small corpus luteum noted.
 Right Ovary:   Not visualized.

 Adnexa:     No abnormality visualized.
Impression

 Siup demonstrating an EGA by ultrasound of 22w 3d. This
 corresponds well with expected EGA by LMP of 21w 6d.

 No focal fetal or placental abnormalities are noted with a
 good anatomic evaluation possible. No soft markers for Down
 Syndrome are seen. Given the expected age at delivery of
 29, today's normal ultrasound would decrease the age related
 risk for Down Syndrome from [DATE] to [DATE] (Zgk et al).
 Correlation with other aneuploidy screening results, if
 available, would be recommended for a more complete risk
 assessment.

 Subjectively and quantitatively normal amniotic fluid volume.
 Normal cervical length.

 questions or concerns.

## 2012-11-25 ENCOUNTER — Encounter: Payer: Self-pay | Admitting: *Deleted

## 2013-04-25 ENCOUNTER — Other Ambulatory Visit (HOSPITAL_COMMUNITY)
Admission: RE | Admit: 2013-04-25 | Discharge: 2013-04-25 | Disposition: A | Discharge status: Home / Self Care | Payer: Medicaid Other | Source: Ambulatory Visit | Attending: Family Medicine | Admitting: Family Medicine

## 2013-04-25 ENCOUNTER — Ambulatory Visit (INDEPENDENT_AMBULATORY_CARE_PROVIDER_SITE_OTHER): Payer: Medicaid Other | Admitting: Emergency Medicine

## 2013-04-25 ENCOUNTER — Encounter: Payer: Self-pay | Admitting: Emergency Medicine

## 2013-04-25 ENCOUNTER — Other Ambulatory Visit (HOSPITAL_COMMUNITY)
Admission: RE | Admit: 2013-04-25 | Discharge: 2013-04-25 | Disposition: A | Discharge status: Home / Self Care | Payer: Medicaid Other | Source: Ambulatory Visit | Attending: Emergency Medicine | Admitting: Emergency Medicine

## 2013-04-25 ENCOUNTER — Telehealth: Payer: Self-pay | Admitting: Emergency Medicine

## 2013-04-25 VITALS — BP 130/85 | HR 100 | Temp 98.4°F | Ht 61.0 in | Wt 197.2 lb

## 2013-04-25 DIAGNOSIS — Z1151 Encounter for screening for human papillomavirus (HPV): Secondary | ICD-10-CM | POA: Insufficient documentation

## 2013-04-25 DIAGNOSIS — Z01419 Encounter for gynecological examination (general) (routine) without abnormal findings: Secondary | ICD-10-CM | POA: Insufficient documentation

## 2013-04-25 DIAGNOSIS — Z124 Encounter for screening for malignant neoplasm of cervix: Secondary | ICD-10-CM

## 2013-04-25 DIAGNOSIS — Z0000 Encounter for general adult medical examination without abnormal findings: Secondary | ICD-10-CM

## 2013-04-25 DIAGNOSIS — Z23 Encounter for immunization: Secondary | ICD-10-CM

## 2013-04-25 DIAGNOSIS — N898 Other specified noninflammatory disorders of vagina: Secondary | ICD-10-CM

## 2013-04-25 DIAGNOSIS — Z113 Encounter for screening for infections with a predominantly sexual mode of transmission: Secondary | ICD-10-CM | POA: Insufficient documentation

## 2013-04-25 DIAGNOSIS — N760 Acute vaginitis: Secondary | ICD-10-CM

## 2013-04-25 LAB — CBC
HCT: 39.4 % (ref 36.0–46.0)
Hemoglobin: 13.1 g/dL (ref 12.0–15.0)
MCH: 29.5 pg (ref 26.0–34.0)
MCHC: 33.2 g/dL (ref 30.0–36.0)
RDW: 14.7 % (ref 11.5–15.5)

## 2013-04-25 LAB — POCT WET PREP (WET MOUNT)
Clue Cells Wet Prep Whiff POC: POSITIVE
WBC, Wet Prep HPF POC: >20

## 2013-04-25 LAB — COMPREHENSIVE METABOLIC PANEL
ALT: 13 U/L (ref 0–35)
AST: 14 U/L (ref 0–37)
Alkaline Phosphatase: 50 U/L (ref 39–117)
BUN: 9 mg/dL (ref 6–23)
Calcium: 9 mg/dL (ref 8.4–10.5)
Chloride: 105 mEq/L (ref 96–112)
Creat: 0.83 mg/dL (ref 0.50–1.10)
Potassium: 3.9 mEq/L (ref 3.5–5.3)

## 2013-04-25 LAB — LIPID PANEL
LDL Cholesterol: 132 mg/dL — ABNORMAL HIGH (ref 0–99)
Total CHOL/HDL Ratio: 4 Ratio
Triglycerides: 120 mg/dL (ref <150)
VLDL: 24 mg/dL (ref 0–40)

## 2013-04-25 MED ORDER — METRONIDAZOLE 500 MG PO TABS: 500.0000 mg | ORAL_TABLET | Freq: Two times a day (BID) | ORAL | Status: DC

## 2013-04-25 NOTE — Patient Instructions (Addendum)
It was nice to see you!  I will call you with the results of your test next week.  If you haven't heard from me by Tuesday, give our office a call.  I also checked some blood work today.    Please make an appointment with Dr. Gwenlyn Saran for a complete physical in the next 1-2 months.

## 2013-04-25 NOTE — Progress Notes (Signed)
  Subjective:    Patient ID: [name] Andrade, female    DOB: 07/13/1981, 32 y.o.   MRN: [id number]  HPI [name] Andrade is here for a pap smear and STD check.  Pap smear She is due for a pap smear.  Denies any history of abnormal pap smears.  Never had a colposcopy or cervical biopsy in the past.  She is currently single.    STD check She has had several new partners in the last year.  Reports excellent condom use.  Had BTL 2 years ago.  H/o chlamydia in 2012, treated during pregnancy.  Reports a foul smelling  Yellow vaginal discharge for the last 2-3 days.  Mild vaginal itching.  No pelvic pain or fevers.  I have reviewed and updated the following as appropriate: allergies and current medications SHx: former smoker  Review of Systems See HPI    Objective:   Physical Exam BP 130/85  Pulse 100  Temp(Src) 98.4 F (36.9 C) (Oral)  Ht 5\' 1"  (1.549 m)  Wt 197 lb 3.2 oz (89.449 kg)  BMI 37.28 kg/m2  LMP 03/31/2013 Gen: alert, cooperative, NAD HEENT: AT/[place], sclera white, MMM Pelvic: normal external genitalia; normal vaginal mucosa; moderate amount of yellowish discharge present; cervix normal; bimanual exam negative for CMT; no adnexal tenderness or fullness      Assessment & Plan:

## 2013-04-25 NOTE — Assessment & Plan Note (Signed)
Suspect possible BV based on history. Will check wet prep and GC/Chlamydia. Pap smear collected as well. Will call with the results of the testing. Follow up in 1-2 months with Dr. Gwenlyn Saran for complete physical.

## 2013-04-25 NOTE — Telephone Encounter (Signed)
Called and spoke with patient about wet prep results.  Will treat BV with flagyl 500mg  BID x7 days.

## 2013-05-01 ENCOUNTER — Encounter: Payer: Self-pay | Admitting: Emergency Medicine

## 2014-02-27 ENCOUNTER — Ambulatory Visit (INDEPENDENT_AMBULATORY_CARE_PROVIDER_SITE_OTHER): Payer: Medicaid Other | Admitting: Family Medicine

## 2014-02-27 ENCOUNTER — Encounter: Payer: Self-pay | Admitting: Family Medicine

## 2014-02-27 VITALS — BP 131/84 | HR 69 | Temp 98.3°F | Ht 61.0 in | Wt 192.3 lb

## 2014-02-27 DIAGNOSIS — B86 Scabies: Secondary | ICD-10-CM

## 2014-02-27 MED ORDER — PERMETHRIN 5 % EX CREA: 1.0000 "application " | TOPICAL_CREAM | Freq: Once | CUTANEOUS | Status: DC

## 2014-02-27 MED ORDER — HYDROXYZINE PAMOATE 25 MG PO CAPS: 25.0000 mg | ORAL_CAPSULE | ORAL | Status: DC | PRN

## 2014-02-27 NOTE — Progress Notes (Signed)
   Subjective:    Patient ID: [name] Andrade, female    DOB: 1980/08/11, 33 y.o.   MRN: [id number]  HPI Comments: She reports bumps over her arms, back, and legs for the last 2 weeks.  The bumps are extremely itchy, but not painful.  She has been outside a lot, originally thought they were mosquito bites.  However, she comes in today because the itching persist despite taking Benadryl and cortisone 10 cream.  She reports the itching is worse with heat.  She has not noticed that it is worse during any particular time of day.  She denies any fevers, chills, or rash.  She denies any new contacts from perfumes, lotions, laundry detergents and denies any new medications.     Review of Systems See HPI     Objective:   Physical Exam  Vitals reviewed. Constitutional: She appears well-developed and well-nourished.  Skin:  1-2 mm, erythematous papules with secondary excoriations located on arms, upper back, thighs and lower legs.   Assessment/Plan:      See Problem Focused Assessment & Plan

## 2014-02-27 NOTE — Assessment & Plan Note (Signed)
Permethrin cream - Hydroxyzine for itching

## 2014-03-04 ENCOUNTER — Ambulatory Visit (INDEPENDENT_AMBULATORY_CARE_PROVIDER_SITE_OTHER): Payer: Medicaid Other | Admitting: Family Medicine

## 2014-03-04 ENCOUNTER — Other Ambulatory Visit (HOSPITAL_COMMUNITY)
Admission: RE | Admit: 2014-03-04 | Discharge: 2014-03-04 | Disposition: A | Discharge status: Home / Self Care | Payer: Medicaid Other | Source: Ambulatory Visit | Attending: Family Medicine | Admitting: Family Medicine

## 2014-03-04 ENCOUNTER — Encounter: Payer: Self-pay | Admitting: Family Medicine

## 2014-03-04 VITALS — BP 120/69 | HR 77 | Temp 98.3°F | Ht 61.0 in | Wt 189.2 lb

## 2014-03-04 DIAGNOSIS — Z113 Encounter for screening for infections with a predominantly sexual mode of transmission: Secondary | ICD-10-CM | POA: Insufficient documentation

## 2014-03-04 DIAGNOSIS — N760 Acute vaginitis: Secondary | ICD-10-CM

## 2014-03-04 LAB — POCT WET PREP (WET MOUNT): Clue Cells Wet Prep Whiff POC: POSITIVE

## 2014-03-04 NOTE — Progress Notes (Signed)
  Subjective:    [name] Andrade is a 33 y.o. female who presents for sexually transmitted disease check. Sexual history reviewed with the patient. STI Exposure: denies knowledge of risky exposure. Previous history of STI none. Current symptoms vaginal discharge: copious and white.  Has been ongoing now for 5 days, concern for yeast infection.  She denies any fever, chills, sweats.   Menstrual History: OB History   Grav Para Term Preterm Abortions TAB SAB Ect Mult Living   4 4 4  0 0 0 0 0 0 4     Obstetric Comments   LMP is 05-31-10      Patient's last menstrual period was 02/10/2014.    The following portions of the patient's history were reviewed and updated as appropriate: allergies, current medications, past family history, past medical history, past social history, past surgical history and problem list.  Review of Systems Pertinent items are noted in HPI.    Objective:    BP 120/69  Pulse 77  Temp(Src) 98.3 F (36.8 C) (Oral)  Ht 5\' 1"  (1.549 m)  Wt 189 lb 3.2 oz (85.821 kg)  BMI 35.77 kg/m2  LMP 02/10/2014 General:   alert, cooperative and appears stated age  Lymph Nodes:   Cervical, supraclavicular, and axillary nodes normal.  Pelvis:  Vulva and vagina appear normal. Bimanual exam reveals normal uterus and adnexa.  Cultures:  GC and Chlamydia genprobes, HIV antibody blood test and RPR blood test     Assessment:    Very low risk of STD exposure, Vaginitis       Plan:     Wet Prep today GC/Chlamydia, HIV/RPR testing

## 2014-03-04 NOTE — Patient Instructions (Signed)

## 2014-03-04 NOTE — Addendum Note (Signed)
Addended by: Briscoe DeutscherHESS, Amethyst Gainer R on: 03/04/2014 04:53 PM   Modules accepted: Orders

## 2014-03-05 ENCOUNTER — Telehealth: Payer: Self-pay | Admitting: Family Medicine

## 2014-03-05 ENCOUNTER — Encounter: Payer: Self-pay | Admitting: Family Medicine

## 2014-03-05 LAB — RPR

## 2014-03-05 LAB — HIV ANTIBODY (ROUTINE TESTING W REFLEX): HIV 1&2 Ab, 4th Generation: NONREACTIVE

## 2014-03-05 MED ORDER — METRONIDAZOLE 500 MG PO TABS: 500.0000 mg | ORAL_TABLET | Freq: Two times a day (BID) | ORAL | Status: DC

## 2014-03-05 NOTE — Telephone Encounter (Signed)
Tried calling pt's work, home phone, and emergency contact number.  No response from any.  If pt calls, please have her leave a message that we can get in touch with her.  I have called in Flagyl 500 mg BID x 7 days for her BV.    Thanks Jaeleigh Monaco R. Paulina FusiHess, DO of [facility] 03/05/2014, 9:29 AM

## 2014-03-05 NOTE — Telephone Encounter (Signed)
Message delivered

## 2014-03-18 ENCOUNTER — Encounter: Payer: Self-pay | Admitting: Family Medicine

## 2014-03-18 ENCOUNTER — Ambulatory Visit (INDEPENDENT_AMBULATORY_CARE_PROVIDER_SITE_OTHER): Payer: Medicaid Other | Admitting: Family Medicine

## 2014-03-18 VITALS — BP 142/82 | HR 92 | Temp 98.2°F | Wt 185.8 lb

## 2014-03-18 DIAGNOSIS — J45909 Unspecified asthma, uncomplicated: Secondary | ICD-10-CM

## 2014-03-18 DIAGNOSIS — J4520 Mild intermittent asthma, uncomplicated: Secondary | ICD-10-CM

## 2014-03-18 DIAGNOSIS — R358 Other polyuria: Secondary | ICD-10-CM

## 2014-03-18 DIAGNOSIS — B373 Candidiasis of vulva and vagina: Secondary | ICD-10-CM

## 2014-03-18 DIAGNOSIS — B3731 Acute candidiasis of vulva and vagina: Secondary | ICD-10-CM

## 2014-03-18 DIAGNOSIS — R631 Polydipsia: Secondary | ICD-10-CM | POA: Insufficient documentation

## 2014-03-18 DIAGNOSIS — R3589 Other polyuria: Secondary | ICD-10-CM

## 2014-03-18 LAB — POCT GLYCOSYLATED HEMOGLOBIN (HGB A1C): Hemoglobin A1C: 5.6

## 2014-03-18 MED ORDER — CETIRIZINE HCL 10 MG PO TABS: 10.0000 mg | ORAL_TABLET | Freq: Every day | ORAL | Status: DC

## 2014-03-18 MED ORDER — ALBUTEROL SULFATE HFA 108 (90 BASE) MCG/ACT IN AERS: 2.0000 | INHALATION_SPRAY | Freq: Four times a day (QID) | RESPIRATORY_TRACT | Status: DC | PRN

## 2014-03-18 MED ORDER — FLUCONAZOLE 150 MG PO TABS: 150.0000 mg | ORAL_TABLET | Freq: Once | ORAL | Status: DC

## 2014-03-18 NOTE — Progress Notes (Signed)
   Subjective:    Patient ID: [name] Andrade, female    DOB: 29-Aug-1980, 33 y.o.   MRN: [id number]  HPI: Pt presents to clinic to f/u recent STD treatment, concerned that she has a yeast infection. She would also like to be tested for DM.  STD f/u - pt was recently treated for BV with negative RPR, HIV, and GC/Chlamydia - for 5 days, she has had itching and whitish, thick discharge with a bad odor - this is similar to a yeast infection she had when she was pregnant - she tried Monistat, which didn't help - she has no pain when she urinates and no vaginal bleeding  Concern for DM - pt has a heavy family history of DM and requests testing - she reports heavy sweating at work; she does housekeeping work - she also reports random sweating at times "just sitting outside" - denies numbness / tingling in her feet but does have frequent thirst (long-standing) - denies frequent urination or change in urination  Review of Systems: As above. Does complain of allergy symptoms (cough, congestion, stuffy nose, etc) which is worse with changes in weather, which in turn triggers her asthma. Normally she has no asthma symptoms and denies chronic SOB.     Objective:   Physical Exam BP 142/82  Pulse 92  Temp(Src) 98.2 F (36.8 C) (Oral)  Wt 185 lb 12.8 oz (84.278 kg)  LMP 02/10/2014 Gen: well-appearing adult female in NAD HEENT: loud nasal congestion but no frank rhinorrhea or eye discharge  [place]/AT, EOMI, MMM, TM's clear bilaterally, posterior oropharynx clear Cardio: RRR, no murmur appreciated Pulm: CTAB, no wheezes, normal WOB Abd: soft, nontender, BS+ Pelvic: pt declined exam Ext: warm, well-perfused, no LE edema     Assessment & Plan:

## 2014-03-18 NOTE — Patient Instructions (Signed)
Thank you for coming in, today!  We will treat you for a yeast infection. Take one pill of Diflucan (fluconazole), 150 mg, one time. You may take a second pill in 3 days if you still have symptoms. You may take a third pill in about a week if you have symptoms after that. If this medicine doesn't help or you have other symptoms, come back to see me.  We will check your A1c (which is an estimate of your blood sugar over three months). We will also check your kidney function. If these are abnormal I will call you. Otherwise, I will send you a letter.  I sent in a refill for your albuterol inhaler. I also prescribed a medicine called Zyrtec (cetirizine) that will help with allergies. Take the Zyrtec every day. Use the albuterol only as needed.  Come back to see me as you need. Please feel free to call with any questions or concerns at any time, at [phone]. --Dr. Casper Harrison

## 2014-03-19 LAB — BASIC METABOLIC PANEL
BUN: 9 mg/dL (ref 6–23)
CHLORIDE: 106 meq/L (ref 96–112)
CO2: 27 meq/L (ref 19–32)
Calcium: 8.5 mg/dL (ref 8.4–10.5)
Creat: 0.69 mg/dL (ref 0.50–1.10)
GLUCOSE: 88 mg/dL (ref 70–99)
POTASSIUM: 3.3 meq/L — AB (ref 3.5–5.3)
SODIUM: 140 meq/L (ref 135–145)

## 2014-03-19 NOTE — Assessment & Plan Note (Signed)
Vaguely described polydipsia without polyuria. Heavy family history of DM. A1c last year was 5.5. Pt requests recheck. Will check BMP and A1c and f/u as needed.

## 2014-03-19 NOTE — Assessment & Plan Note (Signed)
A: Mild / intermittent symptoms, mostly with changes in weather and with URI's / colds / etc. Currently with some coryza-type symptoms but no frank wheeze on exam, fever, or other worrisome signs / symptoms.  A: Refilled albuterol and cetirizine. Strongly recommended regular / consistent use of antihistamine. F/u as needed; encouraged specific f/u if symptoms become persistent to eval for need for long-acting controller medication (would favor something like Singulair or Spiriva to start).

## 2014-03-19 NOTE — Assessment & Plan Note (Signed)
A: Typical history / complaints with recent use of antibiotics, though pt does decline pelvic exam today. No signs / symptoms to suggest frank systemic infection.  P: Diflucan 150 mg one pill once; Rx given for 3 tablets -- repeat tablet in 3 days if no improvement, and again in 1 week if recurrent or persistent symptoms. F/u PRN for full pelvic / recheck if not improved after this.

## 2014-03-23 ENCOUNTER — Encounter: Payer: Self-pay | Admitting: Family Medicine

## 2014-05-15 ENCOUNTER — Other Ambulatory Visit: Payer: Self-pay | Admitting: Family Medicine

## 2014-05-25 ENCOUNTER — Encounter: Payer: Self-pay | Admitting: Family Medicine

## 2015-07-07 ENCOUNTER — Other Ambulatory Visit (HOSPITAL_COMMUNITY)
Admission: RE | Admit: 2015-07-07 | Discharge: 2015-07-07 | Disposition: A | Discharge status: Home / Self Care | Payer: Medicaid Other | Source: Ambulatory Visit | Attending: Family Medicine | Admitting: Family Medicine

## 2015-07-07 ENCOUNTER — Ambulatory Visit (INDEPENDENT_AMBULATORY_CARE_PROVIDER_SITE_OTHER): Payer: Medicaid Other | Admitting: Family Medicine

## 2015-07-07 ENCOUNTER — Telehealth: Payer: Self-pay | Admitting: Family Medicine

## 2015-07-07 ENCOUNTER — Encounter: Payer: Self-pay | Admitting: Family Medicine

## 2015-07-07 VITALS — BP 127/74 | HR 68 | Temp 98.0°F | Wt 180.2 lb

## 2015-07-07 DIAGNOSIS — A499 Bacterial infection, unspecified: Secondary | ICD-10-CM

## 2015-07-07 DIAGNOSIS — Z0000 Encounter for general adult medical examination without abnormal findings: Secondary | ICD-10-CM

## 2015-07-07 DIAGNOSIS — E669 Obesity, unspecified: Secondary | ICD-10-CM

## 2015-07-07 DIAGNOSIS — Z1151 Encounter for screening for human papillomavirus (HPV): Secondary | ICD-10-CM | POA: Insufficient documentation

## 2015-07-07 DIAGNOSIS — N760 Acute vaginitis: Secondary | ICD-10-CM | POA: Diagnosis not present

## 2015-07-07 DIAGNOSIS — B9689 Other specified bacterial agents as the cause of diseases classified elsewhere: Secondary | ICD-10-CM | POA: Insufficient documentation

## 2015-07-07 DIAGNOSIS — Z01411 Encounter for gynecological examination (general) (routine) with abnormal findings: Secondary | ICD-10-CM | POA: Insufficient documentation

## 2015-07-07 LAB — LIPID PANEL
CHOLESTEROL: 192 mg/dL (ref 125–200)
HDL: 55 mg/dL (ref >=46)
LDL Cholesterol: 125 mg/dL (ref <130)
TRIGLYCERIDES: 60 mg/dL (ref <150)
Total CHOL/HDL Ratio: 3.5 Ratio (ref <=5.0)
VLDL: 12 mg/dL (ref <30)

## 2015-07-07 LAB — BASIC METABOLIC PANEL
BUN: 11 mg/dL (ref 7–25)
CALCIUM: 9 mg/dL (ref 8.6–10.2)
CHLORIDE: 103 mmol/L (ref 98–110)
CO2: 25 mmol/L (ref 20–31)
CREATININE: 0.78 mg/dL (ref 0.50–1.10)
GLUCOSE: 78 mg/dL (ref 65–99)
Potassium: 3.8 mmol/L (ref 3.5–5.3)
Sodium: 136 mmol/L (ref 135–146)

## 2015-07-07 LAB — CBC
HCT: 37.9 % (ref 36.0–46.0)
HEMOGLOBIN: 12.6 g/dL (ref 12.0–15.0)
MCH: 28.9 pg (ref 26.0–34.0)
MCHC: 33.2 g/dL (ref 30.0–36.0)
MCV: 86.9 fL (ref 78.0–100.0)
MPV: 9.3 fL (ref 8.6–12.4)
Platelets: 380 10*3/uL (ref 150–400)
RBC: 4.36 MIL/uL (ref 3.87–5.11)
RDW: 15.2 % (ref 11.5–15.5)
WBC: 7.4 10*3/uL (ref 4.0–10.5)

## 2015-07-07 LAB — POCT WET PREP (WET MOUNT): Clue Cells Wet Prep Whiff POC: NEGATIVE

## 2015-07-07 LAB — POCT GLYCOSYLATED HEMOGLOBIN (HGB A1C): Hemoglobin A1C: 5.4

## 2015-07-07 MED ORDER — METRONIDAZOLE 500 MG PO TABS: 500.0000 mg | ORAL_TABLET | Freq: Two times a day (BID) | ORAL | Status: DC

## 2015-07-07 NOTE — Patient Instructions (Signed)
I have prescribed you Flagyl twice daily for  7 days for bacterial vaginosis. Unfortunately, there is no other medication that has been show to treat this as well as the Flagyl. Our office will contact you with the results of your pap smear.  Things to do to Keep yourself Healthy - Exercise at least 30-45 minutes a day,  3-4 days a week.  - Eat a low-fat diet with lots of fruits and vegetables, up to 7-9 servings per day. - Seatbelts can save your life. Wear them always. - Smoke detectors on every level of your home, check batteries every year. - Eye Doctor - have an eye exam every 1-2 years - Safe sex - if you may be exposed to STDs, use a condom. - Alcohol If you drink, do it moderately,less than 2 drinks per day. - Health Care Power of Attorney.  Choose someone to speak for you if you are not able. - Depression is common in our stressful world.If you're feeling down or losing interest in things you normally enjoy, please come in for a visit. - Violence - If anyone is threatening or hurting you, please call immediately.

## 2015-07-07 NOTE — Telephone Encounter (Signed)
Pt is calling again for her pap smear results from this morning.  jw

## 2015-07-07 NOTE — Progress Notes (Signed)
Patient ID: [name] Andrade, female   DOB: 10/19/1980, 34 y.o.   MRN: [id number] 34 y.o. year old female presents for well woman/preventative visit and annual GYN examination.  Acute Concerns: H/o bacterial vaginosis. No vaginal discharge but notes an abnormal smell (fishy). No concerns about STDs, her partner just got tested.   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   Sexual/Birth History: [id number]  LMP 06/13/15, regular  Birth Control: s/p BTL  Patient declined flu vaccine.   POA/Living Will: given information  Social:  Social History   Social History  . Marital Status: Single    Spouse Name: N/A  . Number of Children: N/A  . Years of Education: N/A   Social History Main Topics  . Smoking status: Former Smoker -- 0.25 packs/day for 5 years    Types: Cigarettes    Quit date: 05/24/2010  . Smokeless tobacco: Never Used  . Alcohol Use: No  . Drug Use: No  . Sexual Activity: Yes    Birth Control/ Protection: None   Other Topics Concern  . None   Social History Narrative    Immunization: Immunization History  Administered Date(s) Administered  . Influenza Split 04/25/2011  . Influenza,inj,Quad PF,36+ Mos 04/25/2013  . Tdap 04/25/2011    Cancer Screening:  Pap Smear: 03/2010 > negative for intraepithelial lesion or malignancy.   Mammogram: N/A, no family history   Colonoscopy: N/A, no family history  Dexa: N/A    Physical Exam: Blood pressure 127/74, pulse 68, temperature 98 F (36.7 C), temperature source Oral, weight 180 lb 3.2 oz (81.738 kg), last menstrual period 06/13/2015.  GEN: Pleasant female, NAD HEENT: Normocephalic, PERRL, EOMI, no scleral icterus, bilateral TM pearly grey, nasal septum midline, MMM, uvula midline, no anterior or posterior lymphadenopathy, no thyromegaly CARDIAC:RRR, S1 and S2 present, no murmur, no heaves/thrills RESP: CTAB, normal effort ABD: soft, no tenderness, normal bowel sounds GU/GYN:Exam  performed in the presence of a chaperone  External genitalia within normal limits.  Vaginal mucosa pink, moist, normal rugae.  Nonfriable cervix without lesions. Yellow vaginal discharge with some bleeding noted on speculum exam.  Bimanual exam revealed normal, nongravid uterus.  No cervical motion tenderness. No adnexal masses bilaterally.   EXT: No edema, 2+ radial and DP pulses SKIN: no rash  ASSESSMENT & PLAN: 34 y.o. female presents for annual well woman/preventative exam and GYN exam. Please see problem specific assessment and plan.   BV (bacterial vaginosis) Patient endorsing complaints of a "fishy" smelling vaginal discharge. Wet prep with clue cells consistent with BV and symptoms. She does not have insurance that covers Rx. Spoke to pharmacy, Flagyl costs $16.59 which the patient can afford. - Flagyl 500mg  BID x 7 days. Discussed disulfiram reaction.  - RTC precautions discussed.   OBESITY Continues to have some polydipsia. Will check A1c today.  Health care maintenance Patient declined flu vaccine Pap smear with co-testing performed today Lipid panel, CBC, BMET, and A1c performed today.  Early breast and colon cancer screening no indicated at this time.

## 2015-07-07 NOTE — Assessment & Plan Note (Signed)
Patient declined flu vaccine Pap smear with co-testing performed today Lipid panel, CBC, BMET, and A1c performed today.  Early breast and colon cancer screening no indicated at this time.

## 2015-07-07 NOTE — Telephone Encounter (Signed)
Would like test results. °

## 2015-07-07 NOTE — Assessment & Plan Note (Signed)
Patient endorsing complaints of a "fishy" smelling vaginal discharge. Wet prep with clue cells consistent with BV and symptoms. She does not have insurance that covers Rx. Spoke to pharmacy, Flagyl costs $16.59 which the patient can afford. - Flagyl 500mg  BID x 7 days. Discussed disulfiram reaction.  - RTC precautions discussed.

## 2015-07-07 NOTE — Assessment & Plan Note (Signed)
Continues to have some polydipsia. Will check A1c today.

## 2015-07-07 NOTE — Telephone Encounter (Signed)
Patient informed that pap results take 3-4 days to come back.

## 2015-07-07 NOTE — Telephone Encounter (Signed)
Need pap smear test results

## 2015-07-09 LAB — CYTOLOGY - PAP

## 2015-07-14 ENCOUNTER — Telehealth: Payer: Self-pay | Admitting: Family Medicine

## 2015-07-14 NOTE — Telephone Encounter (Signed)
Called to discuss pap smear results with the patient: ASCUS with + high risk HPV. Patient advised to make an appointment with our clinic for a colposcopy. She will call back tomorrow morning.   Joanna Puffrystal S. Baruch Lewers, MD [facility] Resident  07/14/2015, 5:46 PM

## 2015-07-22 ENCOUNTER — Ambulatory Visit: Payer: Medicaid Other

## 2015-08-05 ENCOUNTER — Ambulatory Visit (INDEPENDENT_AMBULATORY_CARE_PROVIDER_SITE_OTHER): Payer: Self-pay | Admitting: Family Medicine

## 2015-08-05 VITALS — BP 134/78 | HR 77 | Temp 98.3°F | Ht 61.0 in | Wt 178.9 lb

## 2015-08-05 DIAGNOSIS — IMO0002 Reserved for concepts with insufficient information to code with codable children: Secondary | ICD-10-CM

## 2015-08-05 DIAGNOSIS — R896 Abnormal cytological findings in specimens from other organs, systems and tissues: Secondary | ICD-10-CM

## 2015-08-05 DIAGNOSIS — R87611 Atypical squamous cells cannot exclude high grade squamous intraepithelial lesion on cytologic smear of cervix (ASC-H): Secondary | ICD-10-CM

## 2015-08-05 NOTE — Patient Instructions (Signed)
It was good to see you. It is normally to have some brown/black vaginal discharge a few days. You can also have some spotting. If it becomes heavy, please let our clinic know  Colposcopy, Care After Refer to this sheet in the next few weeks. These instructions provide you with information on caring for yourself after your procedure. Your health care provider may also give you more specific instructions. Your treatment has been planned according to current medical practices, but problems sometimes occur. Call your health care provider if you have any problems or questions after your procedure. WHAT TO EXPECT AFTER THE PROCEDURE  After your procedure, it is typical to have the following:  Cramping. This often goes away in a few minutes.  Soreness. This may last for 2 days.  Lightheadedness. Lie down for a few minutes if this occurs. You may also have some bleeding or dark discharge for a few days. You may need to wear a sanitary pad during this time. HOME CARE INSTRUCTIONS  Avoid sex, douching, and using tampons for 3 days or as directed by your health care provider.  Only take over-the-counter or prescription medicines as directed by your health care provider. Do not take aspirin because it can cause bleeding.  Continue to take birth control pills if you are on them.  Not all test results are available during your visit. If your test results are not back during the visit, make an appointment with your health care provider to find out the results. Do not assume everything is normal if you have not heard from your health care provider or the medical facility. It is important for you to follow up on all of your test results.  Follow your health care provider's advice regarding activity, follow-up visits, and follow-up Pap tests. SEEK MEDICAL CARE IF:  You develop a rash.  You have problems with your medicine. SEEK IMMEDIATE MEDICAL CARE IF:  You are bleeding heavily or are passing blood  clots.  You have a fever.  You have abnormal vaginal discharge.  You are having cramps that do not go away after taking your pain medicine.  You feel lightheaded, dizzy, or faint.  You have stomach pain.   This information is not intended to replace advice given to you by your health care provider. Make sure you discuss any questions you have with your health care provider.   Document Released: 04/30/2013 Document Reviewed: 04/30/2013 Elsevier Interactive Patient Education [employer]2016 Elsevier Inc.

## 2015-08-05 NOTE — Progress Notes (Signed)
Patient ID: [name] Andrade, female   DOB: October 06, 1980, 35 y.o.   MRN: [id number]  Chief Complaint  Patient presents with  . Colposcopy    HPI [name] Andrade is a 35 y.o. female presenting for colposcopy. She is currently at the end of her menstrual period. LMP 07/30/15. HPI  Some light vaginal bleeding, no vaginal discharge. No fevers. No abdominal pain.   Indications: Pap smear on December 2016 showed: ASCUS with POSITIVE high risk HPV. Previous colposcopy performed per patient not in our records and patient cannot recall findings.  Prior cervical treatment: no treatment.  Past Medical History  Diagnosis Date  . Obesity   . Obesity   . Asthma     Past Surgical History  Procedure Laterality Date  . Vaginal delivery    . No past surgeries    . Tubal ligation  02/24/2011    Procedure: POST PARTUM TUBAL LIGATION;  Surgeon: Catalina Antigua, MD;  Location: [facility];  Service: Gynecology;  Laterality: Bilateral;    Family History  Problem Relation Age of Onset  . Hypertension Mother   . Diabetes Maternal Grandmother     Social History Social History  Substance Use Topics  . Smoking status: Former Smoker -- 0.25 packs/day for 5 years    Types: Cigarettes    Quit date: 05/24/2010  . Smokeless tobacco: Never Used  . Alcohol Use: No    No Known Allergies  Current Outpatient Prescriptions  Medication Sig Dispense Refill  . albuterol (PROVENTIL HFA;VENTOLIN HFA) 108 (90 BASE) MCG/ACT inhaler Inhale 2 puffs into the lungs every 6 (six) hours as needed for wheezing or shortness of breath. 1 Inhaler 2  . cetirizine (ZYRTEC) 10 MG tablet TAKE 1 TABLET (10 MG TOTAL) BY MOUTH DAILY. 30 tablet 3  . ferrous sulfate 325 (65 FE) MG tablet Take 1 tablet (325 mg total) by mouth 2 (two) times daily with a meal. 60 tablet 1  . hydrOXYzine (VISTARIL) 25 MG capsule Take 1 capsule (25 mg total) by mouth every 4 (four) hours as needed. 30 capsule 0  . metroNIDAZOLE (FLAGYL) 500 MG tablet Take 1 tablet  (500 mg total) by mouth 2 (two) times daily. 14 tablet 0   No current facility-administered medications for this visit.    Review of Systems Review of Systems  Constitutional: Negative for fever and fatigue.  HENT: Negative for congestion.   Eyes: Negative for visual disturbance.  Respiratory: Negative for chest tightness and shortness of breath.   Cardiovascular: Negative for chest pain.  Gastrointestinal: Negative for abdominal pain.  Endocrine: Negative for heat intolerance.  Genitourinary: Positive for vaginal bleeding. Negative for vaginal discharge, difficulty urinating, genital sores, vaginal pain, menstrual problem, pelvic pain and dyspareunia.  Musculoskeletal: Negative for joint swelling.  Skin: Negative for rash.  Allergic/Immunologic: Negative for food allergies.  Neurological: Negative for dizziness and light-headedness.  Psychiatric/Behavioral: Negative for confusion.    Blood pressure 134/78, pulse 77, temperature 98.3 F (36.8 C), temperature source Oral, height 5\' 1"  (1.549 m), weight 178 lb 14.4 oz (81.149 kg), last menstrual period 07/30/2015.  Physical Exam Physical Exam  Genitourinary:      Physical Exam  Genitourinary:      Data Reviewed Pap smear 07/07/15: ASCUS with + high risk HPV  Assessment    Procedure Details  The risks and benefits of the procedure and Written informed consent obtained.  Speculum placed in vagina and excellent visualization of cervix achieved. No abnormal vasculature noted under green fluorescent light.  Procedure was performed in sterile fashion.  Cervix swabbed x 3 with acetic acid solution. Visualized acetowhite lesions at 3 and 4 o'clock. This was confirmed with Lugol iodine pick up that was absent at 3 and 4 o'clock. Endocervical curretting was performed. Biopsies were obtained from each of the 2 abnormal sites with minimal bleeding. Hemostasis was achieved with monsel's solution.    Specimens: 1 from the 3 o'clock  position  2 from 4 o'clock position (1st sample was very small/minimal)  Complications: none.     Plan    Specimens labelled and sent to Pathology. Will contact patient with pathology results.       Crystal Dorsey 08/05/2015, 9:17 AM

## 2015-08-06 ENCOUNTER — Telehealth: Payer: Self-pay | Admitting: Family Medicine

## 2015-08-06 NOTE — Telephone Encounter (Signed)
I contacted patient about cervical biopsy report showing LSIL. This can regress back to normal or progress to a higher dysplastic grade. For now no treatment is recommended. I advised repeat co-testing in 1 yr. If abnormal again then she will benefit from getting treatment. She agreed with plan.  I will forward report to PCP. Also consider early testing at 6 months since she has had previous abnormal test. I will let her PCP decide on this (6 months vs 12 months).

## 2016-03-22 ENCOUNTER — Ambulatory Visit (INDEPENDENT_AMBULATORY_CARE_PROVIDER_SITE_OTHER): Payer: Self-pay | Admitting: Family Medicine

## 2016-03-22 ENCOUNTER — Other Ambulatory Visit (HOSPITAL_COMMUNITY)
Admission: RE | Admit: 2016-03-22 | Discharge: 2016-03-22 | Disposition: A | Discharge status: Home / Self Care | Payer: Medicaid Other | Source: Ambulatory Visit | Attending: Family Medicine | Admitting: Family Medicine

## 2016-03-22 ENCOUNTER — Encounter: Payer: Self-pay | Admitting: Family Medicine

## 2016-03-22 VITALS — BP 125/80 | HR 94 | Temp 98.5°F | Wt 190.8 lb

## 2016-03-22 DIAGNOSIS — Z1151 Encounter for screening for human papillomavirus (HPV): Secondary | ICD-10-CM | POA: Insufficient documentation

## 2016-03-22 DIAGNOSIS — Z1211 Encounter for screening for malignant neoplasm of colon: Secondary | ICD-10-CM

## 2016-03-22 DIAGNOSIS — Z01419 Encounter for gynecological examination (general) (routine) without abnormal findings: Secondary | ICD-10-CM | POA: Insufficient documentation

## 2016-03-22 DIAGNOSIS — Z0000 Encounter for general adult medical examination without abnormal findings: Secondary | ICD-10-CM

## 2016-03-22 DIAGNOSIS — N870 Mild cervical dysplasia: Secondary | ICD-10-CM

## 2016-03-22 NOTE — Progress Notes (Signed)
35 y.o. year old female presents for a f/u pap smear.  Acute Concerns: Her last pap smear with me was in 06/2015 and revealed ASCUS with high risk HPV. She underwent a colposcopy that revealed LSIL/CIN-1 on 08/05/15. As the patient had a h/o abnormal pap smears it was recommended that she have a repeat pap smear in 6 months instead of 1 year. She denies vaginal bleeding, vaginal discharge, or dysuria. Notes constipation prior to menses. No diarrhea or abdominal pain.  Completed ROS completed and negative besides that noted in HPI   Social:  Social History   Social History  . Marital status: Single    Spouse name: N/A  . Number of children: N/A  . Years of education: N/A   Social History Main Topics  . Smoking status: Former Smoker    Packs/day: 0.25    Years: 5.00    Types: Cigarettes    Quit date: 05/24/2010  . Smokeless tobacco: Never Used  . Alcohol use No  . Drug use: No  . Sexual activity: Yes    Birth control/ protection: None   Other Topics Concern  . None   Social History Narrative  . None    Immunization: Immunization History  Administered Date(s) Administered  . Influenza Split 04/25/2011  . Influenza,inj,Quad PF,36+ Mos 04/25/2013  . Tdap 04/25/2011     Physical Exam: Blood pressure 125/80, pulse 94, temperature 98.5 F (36.9 C), temperature source Oral, weight 190 lb 12.8 oz (86.5 kg), last menstrual period 03/01/2016. GEN: Pleasant female, NAD CARDIAC:RRR, S1 and S2 present, no murmur, no heaves/thrills RESP: CTAB, normal effort GYN:  External genitalia within normal limits.  Vaginal mucosa pink, moist, normal rugae.  Nonfriable cervix without lesions, no discharge or bleeding noted on speculum exam.  Bimanual exam revealed normal, nongravid uterus.  No cervical motion tenderness. No adnexal masses bilaterally.   Exam performed in the presence of a chaperone.   ASSESSMENT & PLAN: 35 y.o. female presents for repeat pap smear. No other concerns.    Health care maintenance Patient declined flu vaccine  Dysplasia of cervix, low grade (CIN 1) Repeat pap smear performed today.

## 2016-03-22 NOTE — Patient Instructions (Signed)
It was good to see you again. I will call you if the pap smear is not normal, otherwise I will mail you a letter. IF you decide you'd like the flu vaccine, follow up with us.  Things to do to Keep yourself Healthy  - Exercise at least 30-45 minutes a day,  3-4 days a week.  - Eat a low-fat diet with lots of fruits and vegetables, up to 7-9 servings per day. - Seatbelts can save your life. Wear them always. - Smoke detectors on every level of your home, check batteries every year. - Eye Doctor - have an eye exam every 1-2 years - Safe sex - if you may be exposed to STDs, use a condom. - Alcohol If you drink, do it moderately,less than 2 drinks per day.

## 2016-03-24 DIAGNOSIS — N870 Mild cervical dysplasia: Secondary | ICD-10-CM | POA: Insufficient documentation

## 2016-03-24 LAB — CYTOLOGY - PAP

## 2016-03-24 NOTE — Assessment & Plan Note (Signed)
Repeat pap smear performed today.

## 2016-03-24 NOTE — Assessment & Plan Note (Signed)
Patient declined flu vaccine.

## 2016-03-28 ENCOUNTER — Encounter: Payer: Self-pay | Admitting: Family Medicine

## 2017-03-19 ENCOUNTER — Encounter: Payer: Self-pay | Admitting: Family Medicine

## 2017-03-19 ENCOUNTER — Ambulatory Visit (INDEPENDENT_AMBULATORY_CARE_PROVIDER_SITE_OTHER): Payer: Self-pay | Admitting: Family Medicine

## 2017-03-19 VITALS — BP 126/82 | HR 82 | Temp 98.4°F | Ht 61.0 in | Wt 212.0 lb

## 2017-03-19 DIAGNOSIS — Z0000 Encounter for general adult medical examination without abnormal findings: Secondary | ICD-10-CM

## 2017-03-19 DIAGNOSIS — Z6841 Body Mass Index (BMI) 40.0 and over, adult: Secondary | ICD-10-CM

## 2017-03-19 DIAGNOSIS — J4520 Mild intermittent asthma, uncomplicated: Secondary | ICD-10-CM

## 2017-03-19 NOTE — Progress Notes (Signed)
   Subjective:   Patient ID: [name] Andrade    DOB: 1980-08-31, 36 y.o. female   MRN: [id number]  CC: "Physical exam"  HPI: [name] Andrade is a 36 y.o. female who presents to clinic today here for annual physical. Problems discussed today are as follows:  Preventative health care: Patient here today for annual physical. States she overall is doing well. Was taking a vaginal probiotic called Pro-B due to concern for bacterial infection but has recently discontinued due to price. Asymptomatic and concern she could get a bacterial infection. Denies risk for STDs. ROS: Denies vaginal discharge, pruritus, pelvic pain, dysuria.  Asthma: Patient without inhaler at home. States she usually requires an inhaler when she gets sick which is a rare. Able to do ADLs and exercise without shortness of breath. ROS: Denies chronic cough, shortness of breath, chest pain.  Obesity: Patient does not exercise. 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 Regularly cooks.  Complete ROS performed, see HPI for pertinent.  PMFSH: Asthma, obesity. Surgical history c-sec and tubal ligation, CIN-I with colposcopy. Family history HTN. Smoking status reviewed. Medications reviewed.  Objective:   BP 126/82   Pulse 82   Temp 98.4 F (36.9 C) (Oral)   Ht 5\' 1"  (1.549 m)   Wt 212 lb (96.2 kg)   SpO2 99%   BMI 40.06 kg/m  Vitals and nursing note reviewed.  General: well nourished, well developed, in no acute distress with non-toxic appearance HEENT: normocephalic, atraumatic, moist mucous membranes Neck: supple, non-tender without lymphadenopathy CV: regular rate and rhythm without murmurs, rubs, or gallops, no lower extremity edema Lungs: clear to auscultation bilaterally with normal work of breathing Abdomen: soft, non-tender, non-distended, no masses or organomegaly palpable, normoactive bowel sounds Skin: warm, dry, no rashes or lesions, cap refill < 2 seconds Extremities: warm and well  perfused, normal tone  Assessment & Plan:   Asthma, mild intermittent Chronic. Minimal symptoms. Rarely uses albuterol inhaler. --Recommending patient receive PFTs from Dr. Raymondo Band  Morbid obesity with BMI of 40.0-44.9, adult (HCC) Chronic. BMI 40. Not currently exercising. 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 --Instructed patient to find some form of exercise to achieve 150 minutes of moderate to high intensity exercise --RTC 6 months  Health care maintenance Up-to-date on Pap smears with negative result last year after CIN-I one year prior. Due in 2 years. Awaiting annual flu shots. Here for annual wellness visit. Non-smoker. --Patient to receive flu shot once vaccine is available --Pap due in 2 years  No orders of the defined types were placed in this encounter.  No orders of the defined types were placed in this encounter.   Durward Parcel, DO [facility], PGY-2 03/19/2017 4:34 PM

## 2017-03-19 NOTE — Assessment & Plan Note (Addendum)
Chronic. BMI 40. Not currently exercising. 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 --Instructed patient to find some form of exercise to achieve 150 minutes of moderate to high intensity exercise --RTC 6 months

## 2017-03-19 NOTE — Assessment & Plan Note (Addendum)
Up-to-date on Pap smears with negative result last year after CIN-I one year prior. Due in 2 years. Awaiting annual flu shots. Here for annual wellness visit. Non-smoker. --Patient to receive flu shot once vaccine is available --Pap due in 2 years

## 2017-03-19 NOTE — Assessment & Plan Note (Addendum)
Chronic. Minimal symptoms. Rarely uses albuterol inhaler. --Recommending patient receive PFTs from Dr. Raymondo Band

## 2017-03-19 NOTE — Assessment & Plan Note (Deleted)
A 

## 2017-03-19 NOTE — Patient Instructions (Signed)
Thank you for coming in to see [name] Andrade today. Please see below to review our plan for today's visit.  1. You do not have many medical problems however her biggest problem is your weight. I would encourage you to find ways to stay active by means of either joining a gym, running in her neighborhood, or joining a fitness club. Try to achieve approximately 150 minutes of moderate to high intensity exercise. Consistently cooking and avoiding eating out often and excessive carbohydrates and sugar beverages can help lower your weight. If you exercise regularly, your body will feel better in about 2 weeks. 2. I would like you to see Dr. Raymondo Band for PFTs to check for your asthma. If you need an inhaler, let [name] Andrade know.  Please call the clinic at [phone] if your symptoms worsen or you have any concerns. It was my pleasure to see you. -- Durward Parcel, DO [facility], PGY-2

## 2017-08-17 ENCOUNTER — Ambulatory Visit (INDEPENDENT_AMBULATORY_CARE_PROVIDER_SITE_OTHER): Payer: Self-pay | Admitting: Family Medicine

## 2017-08-17 ENCOUNTER — Encounter: Payer: Self-pay | Admitting: Family Medicine

## 2017-08-17 VITALS — BP 119/70 | HR 74 | Temp 98.4°F | Wt 211.8 lb

## 2017-08-17 DIAGNOSIS — J069 Acute upper respiratory infection, unspecified: Secondary | ICD-10-CM

## 2017-08-17 DIAGNOSIS — J029 Acute pharyngitis, unspecified: Secondary | ICD-10-CM

## 2017-08-17 NOTE — Assessment & Plan Note (Signed)
Symptoms consistent with a viral URI with nonproductive cough and sore throat x1 week.  Rapid strep negative in office.  Small amount of clear discharge present in ears bilaterally, no erythema or bulging of TM.  No red flags found on exam. -Discussed that this is most likely viral and antibiotics would not play a role in treatment -Advised plenty of warm fluids and rest -May take ibuprofen or Tylenol as needed for fever or discomfort -Return precautions discussed

## 2017-08-17 NOTE — Progress Notes (Addendum)
   Subjective:   Patient ID: [name] Andrade    DOB: 11/29/1980, 37 y.o. female   MRN: [id number]  CC: Sore throat  HPI: [name] Andrade is a 37 y.o. female who presents to clinic today with a sore throat.  Sore throat Patient reports sore throat x1 week duration. She also endorses bilateral ear pain and congestion.  She reports a cough which has also been present for 1 week but is nonproductive and dry.  She denies fevers, chills, has been eating and drinking normally although it is sometimes painful to swallow.  She has been drinking plenty of water and tea, and eating soups.  Denies nausea, vomiting, body aches.  She has taken some ibuprofen for pain which helps for a while but then wears off. No shortness of breath or chest pain. She denies any sick contacts at home.   ROS: No fevers, chills, nausea, vomiting, abdominal pain.  Positive for sore throat.  Social: Former smoker, quit in 2011 Medications reviewed.  Objective:   BP 119/70 (BP Location: Right Arm, Patient Position: Sitting, Cuff Size: Normal)   Pulse 74   Temp 98.4 F (36.9 C) (Oral)   Wt 211 lb 12.8 oz (96.1 kg)   SpO2 99%   BMI 40.02 kg/m  Vitals and nursing note reviewed.  General: pleasant 37 year old female, NAD HEENT: NCAT, EOMI, PERRLA, mild rhinorrhea, MMM, no tonsillar erythema or exudate.  Small amount of clear discharge present in bilateral ears, no erythema or bulging of the tympanic membrane, no purulent discharge present. Neck: supple, nontender CV: RRR no MRG Lungs: CTAB, normal effort, no wheeze Skin: warm, dry, no rash Extremities: warm and well perfused  Assessment & Plan:   Viral URI Symptoms consistent with a viral URI with nonproductive cough and sore throat x1 week.  Rapid strep negative in office.  Small amount of clear discharge present in ears bilaterally, no erythema or bulging of TM.  No red flags found on exam. -Discussed that this is most likely viral and antibiotics would not play a  role in treatment -Advised plenty of warm fluids and rest -May take ibuprofen or Tylenol as needed for fever or discomfort -Return precautions discussed  Orders Placed This Encounter  Procedures  . POCT rapid strep A   Follow-up: if symptoms worsen or do not improve  Freddrick MarchYashika Yaritzy Huser, MD [facility], PGY-2 08/17/2017 9:14 AM

## 2017-08-17 NOTE — Patient Instructions (Signed)
You were seen in clinic for sore throat, congestion, and ear pain.  We tested you for strep throat and it was negative.  Your symptoms are most likely due to a viral upper respiratory infection  and therefore unfortunately, antibiotics would not be of any benefit.  I expect your symptoms to improve after 7-10 days.  You may take Tylenol or ibuprofen as needed for pain or fever.  It is important that you stay well hydrated and drink plenty of warm fluids while you recover.  It is also equally important to make sure you are washing your hands frequently to prevent spread.  If you develop any new or worsening symptoms I would like for you to return to the clinic to be seen by provider. I hope you start to feel better soon.   Be well, [name] MarchYashika Caleb Prigmore, MD   Viral Respiratory Infection A viral respiratory infection is an illness that affects parts of the body used for breathing, like the lungs, nose, and throat. It is caused by a germ called a virus. Some examples of this kind of infection are:  A cold.  The flu (influenza).  A respiratory syncytial virus (RSV) infection.  How do I know if I have this infection? Most of the time this infection causes:  A stuffy or runny nose.  Yellow or green fluid in the nose.  A cough.  Sneezing.  Tiredness (fatigue).  Achy muscles.  A sore throat  Ear pain  Sweating or chills.  A fever.  A headache.  How is this infection treated? If the flu is diagnosed early, it may be treated with an antiviral medicine. This medicine shortens the length of time a person has symptoms. Symptoms may be treated with over-the-counter and prescription medicines, such as:  Expectorants. These make it easier to cough up mucus.  Decongestant nasal sprays.  Doctors do not prescribe antibiotic medicines for viral infections. They do not work with this kind of infection. How do I know if I should stay home? To keep others from getting sick, stay home if you  have:  A fever.  A lasting cough.  A sore throat.  A runny nose.  Sneezing.  Muscles aches.  Headaches.  Tiredness.  Weakness.  Chills.  Sweating.  An upset stomach (nausea).  Follow these instructions at home:  Rest as much as possible.  Take over-the-counter and prescription medicines only as told by your doctor.  Drink enough fluid to keep your pee (urine) clear or pale yellow.  Gargle with salt water. Do this 3-4 times per day or as needed. To make a salt-water mixture, dissolve -1 tsp of salt in 1 cup of warm water. Make sure the salt dissolves all the way.  Use nose drops made from salt water. This helps with stuffiness (congestion). It also helps soften the skin around your nose.  Do not drink alcohol.  Do not use tobacco products, including cigarettes, chewing tobacco, and e-cigarettes. If you need help quitting, ask your doctor. Get help if:  Your symptoms last for 10 days or longer.  Your symptoms get worse over time.  You have a fever.  You have very bad pain in your face or forehead.  Parts of your jaw or neck become very swollen. Get help right away if:  You feel pain or pressure in your chest.  You have shortness of breath.  You faint or feel like you will faint.  You keep throwing up (vomiting).  You feel confused. This  information is not intended to replace advice given to you by your health care provider. Make sure you discuss any questions you have with your health care provider. Document Released: 06/22/2008 Document Revised: 12/16/2015 Document Reviewed: 12/16/2014 Elsevier Interactive Patient Education  2018 [employer].

## 2017-08-23 LAB — POCT RAPID STREP A (OFFICE): RAPID STREP A SCREEN: NEGATIVE

## 2024-06-17 ENCOUNTER — Encounter (HOSPITAL_COMMUNITY): Payer: Self-pay

## 2024-06-17 ENCOUNTER — Ambulatory Visit (HOSPITAL_COMMUNITY)
Admission: EM | Admit: 2024-06-17 | Discharge: 2024-06-17 | Disposition: A | Discharge status: Home / Self Care | Attending: Emergency Medicine | Admitting: Emergency Medicine

## 2024-06-17 DIAGNOSIS — Z113 Encounter for screening for infections with a predominantly sexual mode of transmission: Secondary | ICD-10-CM | POA: Insufficient documentation

## 2024-06-17 DIAGNOSIS — N898 Other specified noninflammatory disorders of vagina: Secondary | ICD-10-CM | POA: Diagnosis not present

## 2024-06-17 NOTE — Discharge Instructions (Signed)
 Your results will come back over the next few days and someone will call if results are positive and require treatment.  Return here as needed.

## 2024-06-17 NOTE — ED Provider Notes (Signed)
 [facility]    CSN: [id number] Arrival date & time: 06/17/24  0846      History   Chief Complaint Chief Complaint  Patient presents with   Vaginal Itching    HPI [name] Andrade is a 43 y.o. female.   Patient presents with vaginal itching and irritation with brown vaginal discharge for approximately 1 week.  Patient denies vaginal pain, vaginal lesions, abnormal vaginal bleeding, dysuria, hematuria, urinary frequency/urgency, and abdominal pain.  Patient reports that she is sexually active.  Patient denies any known exposures to STDs but would like STD testing today.  LMP 11/8.  The history is provided by the patient and medical records.  Vaginal Itching    Past Medical History:  Diagnosis Date   Asthma    Obesity    Obesity     Patient Active Problem List   Diagnosis Date Noted   Viral URI 08/17/2017   Dysplasia of cervix, low grade (CIN 1) 03/24/2016   Health care maintenance 07/07/2015   Polydipsia 03/18/2014   Asthma, mild intermittent 10/26/2010   Morbid obesity with BMI of 40.0-44.9, adult (HCC) 03/30/2010    Past Surgical History:  Procedure Laterality Date   NO PAST SURGERIES     TUBAL LIGATION  02/24/2011   Procedure: POST PARTUM TUBAL LIGATION;  Surgeon: Winton Felt, MD;  Location: [facility];  Service: Gynecology;  Laterality: Bilateral;   VAGINAL DELIVERY      OB History     Gravida  4   Para  4   Term  4   Preterm  0   AB  0   Living  4      SAB  0   IAB  0   Ectopic  0   Multiple  0   Live Births  1        Obstetric Comments  LMP is 05-31-10          Home Medications    Prior to Admission medications   Not on File    Family History Family History  Problem Relation Age of Onset   Hypertension Mother    Diabetes Maternal Grandmother     Social History Social History   Tobacco Use   Smoking status: Former    Current packs/day: 0.00    Average packs/day: 0.3 packs/day for 5.0 years (1.3 ttl  pk-yrs)    Types: Cigarettes    Start date: 05/24/2005    Quit date: 05/24/2010    Years since quitting: 14.0   Smokeless tobacco: Never  Substance Use Topics   Alcohol use: Yes    Comment: 1 a month   Drug use: No     Allergies   Patient has no known allergies.   Review of Systems Review of Systems  Per HPI  Physical Exam Triage Vital Signs ED Triage Vitals  Encounter Vitals Group     BP 06/17/24 0952 (!) 129/91     Girls Systolic BP Percentile --      Girls Diastolic BP Percentile --      Boys Systolic BP Percentile --      Boys Diastolic BP Percentile --      Pulse Rate 06/17/24 0952 68     Resp 06/17/24 0952 16     Temp 06/17/24 0952 99 F (37.2 C)     Temp Source 06/17/24 0952 Oral     SpO2 06/17/24 0952 98 %     Weight --  Height --      Head Circumference --      Peak Flow --      Pain Score 06/17/24 0950 0     Pain Loc --      Pain Education --      Exclude from Growth Chart --    No data found.  Updated Vital Signs BP (!) 129/91 (BP Location: Right Arm)   Pulse 68   Temp 99 F (37.2 C) (Oral)   Resp 16   LMP 05/31/2024 (Exact Date)   SpO2 98%   Visual Acuity Right Eye Distance:   Left Eye Distance:   Bilateral Distance:    Right Eye Near:   Left Eye Near:    Bilateral Near:     Physical Exam Vitals and nursing note reviewed.  Constitutional:      General: She is awake. She is not in acute distress.    Appearance: Normal appearance. She is well-developed and well-groomed. She is not ill-appearing.  Abdominal:     General: Abdomen is flat. Bowel sounds are normal.     Palpations: Abdomen is soft.     Tenderness: There is no abdominal tenderness.  Genitourinary:    Comments: Exam deferred Skin:    General: Skin is warm and dry.  Neurological:     General: No focal deficit present.     Mental Status: She is alert and oriented to person, place, and time. Mental status is at baseline.  Psychiatric:        Behavior: Behavior is  cooperative.      UC Treatments / Results  Labs (all labs ordered are listed, but only abnormal results are displayed) Labs Reviewed  CERVICOVAGINAL ANCILLARY ONLY    EKG   Radiology No results found.  Procedures Procedures (including critical care time)  Medications Ordered in UC Medications - No data to display  Initial Impression / Assessment and Plan / UC Course  I have reviewed the triage vital signs and the nursing notes.  Pertinent labs & imaging results that were available during my care of the patient were reviewed by me and considered in my medical decision making (see chart for details).     Patient is overall well-appearing.  Vitals are stable.  GU exam deferred.  Patient performed self swab for STD/STI.  Declines HIV and RPR.  Discussed follow-up and return precautions. Final Clinical Impressions(s) / UC Diagnoses   Final diagnoses:  Vaginal discharge  Screening for STD (sexually transmitted disease)     Discharge Instructions      Your results will come back over the next few days and someone will call if results are positive and require treatment.  Return here as needed.      ED Prescriptions   None    PDMP not reviewed this encounter.   Johnie Flaming A, NP 06/17/24 1017

## 2024-06-17 NOTE — ED Triage Notes (Signed)
 Pt c/o vaginal itching for over a week.  Requesting STD testing.

## 2024-06-18 LAB — CERVICOVAGINAL ANCILLARY ONLY
Bacterial Vaginitis (gardnerella): POSITIVE — AB
Candida Glabrata: NEGATIVE
Candida Vaginitis: NEGATIVE
Chlamydia: NEGATIVE
Comment: NEGATIVE
Comment: NEGATIVE
Comment: NEGATIVE
Comment: NEGATIVE
Comment: NEGATIVE
Comment: NORMAL
Neisseria Gonorrhea: NEGATIVE
Trichomonas: POSITIVE — AB

## 2024-06-21 ENCOUNTER — Telehealth (HOSPITAL_COMMUNITY): Payer: Self-pay | Admitting: Emergency Medicine

## 2024-06-21 MED ORDER — METRONIDAZOLE 500 MG PO TABS: 500.0000 mg | ORAL_TABLET | Freq: Two times a day (BID) | ORAL | 0 refills | Status: AC

## 2024-06-21 NOTE — Telephone Encounter (Signed)
 Patient's cytology swab revealed that she has bacterial vaginosis and trichomonas.  This requires treatment with metronidazole  twice daily for 7 days.  I have already sent in this prescription for the patient.

## 2024-06-23 NOTE — Telephone Encounter (Signed)
 Pt started her meds yesterday

## 2024-06-24 ENCOUNTER — Ambulatory Visit (HOSPITAL_COMMUNITY): Payer: Self-pay
